# Patient Record
Sex: Male | Born: 1935 | Race: White | Hispanic: No | Marital: Married | Smoking: Former smoker
Health system: Southern US, Community
[De-identification: ages and names within clinical notes are randomized; demographics above are authoritative.]

## PROBLEM LIST (undated history)

## (undated) DIAGNOSIS — E119 Type 2 diabetes mellitus without complications: Secondary | ICD-10-CM

## (undated) DIAGNOSIS — I10 Essential (primary) hypertension: Secondary | ICD-10-CM

## (undated) DIAGNOSIS — N189 Chronic kidney disease, unspecified: Secondary | ICD-10-CM

## (undated) DIAGNOSIS — E785 Hyperlipidemia, unspecified: Secondary | ICD-10-CM

## (undated) HISTORY — PX: APPENDECTOMY: SHX54

## (undated) HISTORY — PX: CATARACT EXTRACTION: SUR2

---

## 2007-02-05 ENCOUNTER — Ambulatory Visit: Payer: Self-pay

## 2007-02-22 ENCOUNTER — Ambulatory Visit: Payer: Self-pay | Admitting: Ophthalmology

## 2007-04-19 ENCOUNTER — Ambulatory Visit: Payer: Self-pay | Admitting: Ophthalmology

## 2009-11-20 ENCOUNTER — Emergency Department: Payer: Self-pay | Admitting: Unknown Physician Specialty

## 2009-12-03 ENCOUNTER — Emergency Department: Payer: Self-pay | Admitting: Emergency Medicine

## 2010-05-24 ENCOUNTER — Inpatient Hospital Stay: Payer: Self-pay | Admitting: Internal Medicine

## 2010-06-19 ENCOUNTER — Ambulatory Visit: Payer: Self-pay | Admitting: Vascular Surgery

## 2010-06-26 ENCOUNTER — Inpatient Hospital Stay: Payer: Self-pay | Admitting: Vascular Surgery

## 2010-06-28 LAB — PATHOLOGY REPORT

## 2010-08-22 ENCOUNTER — Ambulatory Visit: Payer: Self-pay | Admitting: Vascular Surgery

## 2010-12-11 ENCOUNTER — Ambulatory Visit: Payer: Self-pay | Admitting: Vascular Surgery

## 2010-12-18 ENCOUNTER — Inpatient Hospital Stay: Payer: Self-pay | Admitting: Vascular Surgery

## 2012-04-17 ENCOUNTER — Emergency Department: Payer: Self-pay | Admitting: Emergency Medicine

## 2012-04-17 LAB — URINALYSIS, COMPLETE
Blood: NEGATIVE
Glucose,UR: NEGATIVE mg/dL (ref 0–75)
Ketone: NEGATIVE
Nitrite: NEGATIVE
Ph: 5 (ref 4.5–8.0)
Protein: NEGATIVE
RBC,UR: <1 /HPF (ref 0–5)
Specific Gravity: 1.005 (ref 1.003–1.030)
WBC UR: 5 /HPF (ref 0–5)

## 2012-04-17 LAB — COMPREHENSIVE METABOLIC PANEL
Albumin: 3.9 g/dL (ref 3.4–5.0)
Alkaline Phosphatase: 73 U/L (ref 50–136)
Anion Gap: 8 (ref 7–16)
BUN: 21 mg/dL — ABNORMAL HIGH (ref 7–18)
Bilirubin,Total: 0.4 mg/dL (ref 0.2–1.0)
Chloride: 106 mmol/L (ref 98–107)
Co2: 26 mmol/L (ref 21–32)
Creatinine: 1.35 mg/dL — ABNORMAL HIGH (ref 0.60–1.30)
EGFR (Non-African Amer.): 47 — ABNORMAL LOW
Osmolality: 286 (ref 275–301)
SGPT (ALT): 15 U/L (ref 12–78)
Total Protein: 7.2 g/dL (ref 6.4–8.2)

## 2012-04-17 LAB — CBC
HGB: 14.3 g/dL (ref 13.0–18.0)
MCH: 32.8 pg (ref 26.0–34.0)
MCV: 95 fL (ref 80–100)
Platelet: 159 10*3/uL (ref 150–440)
RBC: 4.35 10*6/uL — ABNORMAL LOW (ref 4.40–5.90)
RDW: 13.5 % (ref 11.5–14.5)

## 2012-04-17 LAB — CK TOTAL AND CKMB (NOT AT ARMC): CK, Total: 44 U/L (ref 35–232)

## 2012-04-17 LAB — LIPASE, BLOOD: Lipase: 166 U/L (ref 73–393)

## 2018-10-19 ENCOUNTER — Emergency Department: Payer: Medicare Other

## 2018-10-19 ENCOUNTER — Other Ambulatory Visit: Payer: Self-pay

## 2018-10-19 ENCOUNTER — Inpatient Hospital Stay
Admission: EM | Admit: 2018-10-19 | Discharge: 2018-10-21 | DRG: 312 | Disposition: A | Discharge status: Home / Self Care | Payer: Medicare Other | Attending: Internal Medicine | Admitting: Internal Medicine

## 2018-10-19 DIAGNOSIS — Z79899 Other long term (current) drug therapy: Secondary | ICD-10-CM | POA: Diagnosis not present

## 2018-10-19 DIAGNOSIS — E785 Hyperlipidemia, unspecified: Secondary | ICD-10-CM | POA: Diagnosis present

## 2018-10-19 DIAGNOSIS — R7989 Other specified abnormal findings of blood chemistry: Secondary | ICD-10-CM | POA: Diagnosis present

## 2018-10-19 DIAGNOSIS — I10 Essential (primary) hypertension: Secondary | ICD-10-CM | POA: Insufficient documentation

## 2018-10-19 DIAGNOSIS — R946 Abnormal results of thyroid function studies: Secondary | ICD-10-CM | POA: Diagnosis present

## 2018-10-19 DIAGNOSIS — R000 Tachycardia, unspecified: Secondary | ICD-10-CM | POA: Diagnosis present

## 2018-10-19 DIAGNOSIS — E1122 Type 2 diabetes mellitus with diabetic chronic kidney disease: Secondary | ICD-10-CM | POA: Diagnosis present

## 2018-10-19 DIAGNOSIS — N183 Chronic kidney disease, stage 3 unspecified: Secondary | ICD-10-CM | POA: Diagnosis present

## 2018-10-19 DIAGNOSIS — Z823 Family history of stroke: Secondary | ICD-10-CM

## 2018-10-19 DIAGNOSIS — Z87891 Personal history of nicotine dependence: Secondary | ICD-10-CM | POA: Diagnosis not present

## 2018-10-19 DIAGNOSIS — Z1159 Encounter for screening for other viral diseases: Secondary | ICD-10-CM

## 2018-10-19 DIAGNOSIS — Z8673 Personal history of transient ischemic attack (TIA), and cerebral infarction without residual deficits: Secondary | ICD-10-CM | POA: Diagnosis not present

## 2018-10-19 DIAGNOSIS — R55 Syncope and collapse: Secondary | ICD-10-CM | POA: Diagnosis present

## 2018-10-19 DIAGNOSIS — I129 Hypertensive chronic kidney disease with stage 1 through stage 4 chronic kidney disease, or unspecified chronic kidney disease: Secondary | ICD-10-CM | POA: Diagnosis present

## 2018-10-19 DIAGNOSIS — E119 Type 2 diabetes mellitus without complications: Secondary | ICD-10-CM

## 2018-10-19 DIAGNOSIS — Z801 Family history of malignant neoplasm of trachea, bronchus and lung: Secondary | ICD-10-CM

## 2018-10-19 HISTORY — DX: Type 2 diabetes mellitus without complications: E11.9

## 2018-10-19 HISTORY — DX: Chronic kidney disease, unspecified: N18.9

## 2018-10-19 HISTORY — DX: Hyperlipidemia, unspecified: E78.5

## 2018-10-19 HISTORY — DX: Essential (primary) hypertension: I10

## 2018-10-19 LAB — COMPREHENSIVE METABOLIC PANEL
ALT: 15 U/L (ref 0–44)
AST: 20 U/L (ref 15–41)
Albumin: 4.1 g/dL (ref 3.5–5.0)
Alkaline Phosphatase: 64 U/L (ref 38–126)
Anion gap: 8 (ref 5–15)
BUN: 18 mg/dL (ref 8–23)
CO2: 25 mmol/L (ref 22–32)
Calcium: 9.1 mg/dL (ref 8.9–10.3)
Chloride: 107 mmol/L (ref 98–111)
Creatinine, Ser: 1.44 mg/dL — ABNORMAL HIGH (ref 0.61–1.24)
GFR calc Af Amer: 48 mL/min — ABNORMAL LOW (ref >60)
GFR calc non Af Amer: 41 mL/min — ABNORMAL LOW (ref >60)
Glucose, Bld: 172 mg/dL — ABNORMAL HIGH (ref 70–99)
Potassium: 4.1 mmol/L (ref 3.5–5.1)
Sodium: 140 mmol/L (ref 135–145)
Total Bilirubin: 0.6 mg/dL (ref 0.3–1.2)
Total Protein: 7.3 g/dL (ref 6.5–8.1)

## 2018-10-19 LAB — TROPONIN I: Troponin I: <0.03 ng/mL (ref <0.03)

## 2018-10-19 LAB — CBC WITH DIFFERENTIAL/PLATELET
Abs Immature Granulocytes: 0.06 10*3/uL (ref 0.00–0.07)
Basophils Absolute: 0 10*3/uL (ref 0.0–0.1)
Basophils Relative: 1 %
Eosinophils Absolute: 0.2 10*3/uL (ref 0.0–0.5)
Eosinophils Relative: 3 %
HCT: 41.1 % (ref 39.0–52.0)
Hemoglobin: 13.7 g/dL (ref 13.0–17.0)
Immature Granulocytes: 1 %
Lymphocytes Relative: 19 %
Lymphs Abs: 1.1 10*3/uL (ref 0.7–4.0)
MCH: 30.6 pg (ref 26.0–34.0)
MCHC: 33.3 g/dL (ref 30.0–36.0)
MCV: 91.7 fL (ref 80.0–100.0)
Monocytes Absolute: 0.4 10*3/uL (ref 0.1–1.0)
Monocytes Relative: 8 %
Neutro Abs: 3.9 10*3/uL (ref 1.7–7.7)
Neutrophils Relative %: 68 %
Platelets: 165 10*3/uL (ref 150–400)
RBC: 4.48 MIL/uL (ref 4.22–5.81)
RDW: 14.3 % (ref 11.5–15.5)
WBC: 5.7 10*3/uL (ref 4.0–10.5)
nRBC: 0 % (ref 0.0–0.2)

## 2018-10-19 LAB — APTT: aPTT: 31 seconds (ref 24–36)

## 2018-10-19 LAB — TSH: TSH: 8.625 u[IU]/mL — ABNORMAL HIGH (ref 0.350–4.500)

## 2018-10-19 LAB — SARS CORONAVIRUS 2 BY RT PCR (HOSPITAL ORDER, PERFORMED IN ~~LOC~~ HOSPITAL LAB): SARS Coronavirus 2: NEGATIVE

## 2018-10-19 LAB — PROTIME-INR
INR: 0.9 (ref 0.8–1.2)
Prothrombin Time: 12.2 seconds (ref 11.4–15.2)

## 2018-10-19 LAB — BRAIN NATRIURETIC PEPTIDE: B Natriuretic Peptide: 345 pg/mL — ABNORMAL HIGH (ref 0.0–100.0)

## 2018-10-19 NOTE — ED Notes (Signed)
ED TO INPATIENT HANDOFF REPORT  ED Nurse Name and Phone #: Berline Lopesgracie [phone]  S Name/Age/Gender Joshua ErikssonAndrew C Rogers 85 y.o. male Room/Bed: ED09A/ED09A  Code Status   Code Status: Not on file  Home/SNF/Other Home Patient oriented to: self place time situation  Is this baseline? Yes   Triage Complete: Triage complete  Chief Complaint New onset Afib  Triage Note Patient has syncope episode after mowing lawn. Reports was feeling palpitations then dizzy. Afib on monitor upon ems arrival. Patrient converted  prior to arrival.    Allergies Not on File  Level of Care/Admitting Diagnosis ED Disposition    ED Disposition Condition Comment   Admit  Hospital Area: [facility] [[id number]]  Level of Care: Med-Surg [16]  Covid Evaluation: Confirmed COVID Negative  Diagnosis: Syncope [206001]  Admitting Physician: Oralia ManisWILLIS, DAVID [[id number]]  Attending Physician: Oralia ManisWILLIS, DAVID [phone][[id number]]  Estimated length of stay: past midnight tomorrow  Certification:: I certify this patient will need inpatient services for at least 2 midnights  PT Class (Do Not Modify): Inpatient [101]  PT Acc Code (Do Not Modify): Private [1]       B Medical/Surgery History Past Medical History:  Diagnosis Date  . CKD (chronic kidney disease)   . Diabetes (HCC)   . HLD (hyperlipidemia)   . Hypertension    Past Surgical History:  Procedure Laterality Date  . APPENDECTOMY    . CATARACT EXTRACTION       A IV Location/Drains/Wounds Patient Lines/Drains/Airways Status   Active Line/Drains/Airways    Name:   Placement date:   Placement time:   Site:   Days:   Peripheral IV 10/19/18 Left Antecubital   10/19/18    1902    Antecubital   less than 1          Intake/Output Last 24 hours No intake or output data in the 24 hours ending 10/19/18 2343  Labs/Imaging Results for orders placed or performed during the hospital encounter of 10/19/18 (from the past 48 hour(s))  CBC with Differential      Status: None   Collection Time: 10/19/18  7:12 PM  Result Value Ref Range   WBC 5.7 4.0 - 10.5 K/uL   RBC 4.48 4.22 - 5.81 MIL/uL   Hemoglobin 13.7 13.0 - 17.0 g/dL   HCT 36.641.1 44.039.0 - 34.752.0 %   MCV 91.7 80.0 - 100.0 fL   MCH 30.6 26.0 - 34.0 pg   MCHC 33.3 30.0 - 36.0 g/dL   RDW 42.514.3 95.611.5 - 38.715.5 %   Platelets 165 150 - 400 K/uL   nRBC 0.0 0.0 - 0.2 %   Neutrophils Relative % 68 %   Neutro Abs 3.9 1.7 - 7.7 K/uL   Lymphocytes Relative 19 %   Lymphs Abs 1.1 0.7 - 4.0 K/uL   Monocytes Relative 8 %   Monocytes Absolute 0.4 0.1 - 1.0 K/uL   Eosinophils Relative 3 %   Eosinophils Absolute 0.2 0.0 - 0.5 K/uL   Basophils Relative 1 %   Basophils Absolute 0.0 0.0 - 0.1 K/uL   Immature Granulocytes 1 %   Abs Immature Granulocytes 0.06 0.00 - 0.07 K/uL    Comment: Performed at [facility], [address] [place], [place] [id number]  Comprehensive metabolic panel     Status: Abnormal   Collection Time: 10/19/18  7:12 PM  Result Value Ref Range   Sodium 140 135 - 145 mmol/L   Potassium 4.1 3.5 - 5.1 mmol/L   Chloride 107  98 - 111 mmol/L   CO2 25 22 - 32 mmol/L   Glucose, Bld 172 (H) 70 - 99 mg/dL   BUN 18 8 - 23 mg/dL   Creatinine, Ser 3.61 (H) 0.61 - 1.24 mg/dL   Calcium 9.1 8.9 - 44.3 mg/dL   Total Protein 7.3 6.5 - 8.1 g/dL   Albumin 4.1 3.5 - 5.0 g/dL   AST 20 15 - 41 U/L   ALT 15 0 - 44 U/L   Alkaline Phosphatase 64 38 - 126 U/L   Total Bilirubin 0.6 0.3 - 1.2 mg/dL   GFR calc non Af Amer 41 (L) >60 mL/min   GFR calc Af Amer 48 (L) >60 mL/min   Anion gap 8 5 - 15    Comment: Performed at [facility], [address] [place], [place] [address]  Troponin I - Once     Status: None   Collection Time: 10/19/18  7:12 PM  Result Value Ref Range   Troponin I <0.03 <0.03 ng/mL    Comment: Performed at [facility], [address] [place], [place] [address]  Brain natriuretic peptide     Status: Abnormal   Collection Time: 10/19/18  7:12 PM   Result Value Ref Range   B Natriuretic Peptide 345.0 (H) 0.0 - 100.0 pg/mL    Comment: Performed at [facility], [address] [place], [place] [address]  TSH     Status: Abnormal   Collection Time: 10/19/18  7:12 PM  Result Value Ref Range   TSH 8.625 (H) 0.350 - 4.500 uIU/mL    Comment: Performed by a 3rd Generation assay with a functional sensitivity of <=0.01 uIU/mL. Performed at [facility], [address] [place], [place] [address]   Protime-INR     Status: None   Collection Time: 10/19/18  7:12 PM  Result Value Ref Range   Prothrombin Time 12.2 11.4 - 15.2 seconds   INR 0.9 0.8 - 1.2    Comment: (NOTE) INR goal varies based on device and disease states. Performed at [facility], [address] [place], [place] [address]   APTT     Status: None   Collection Time: 10/19/18  7:12 PM  Result Value Ref Range   aPTT 31 24 - 36 seconds    Comment: Performed at [facility], [address] [place], [place] [address]  SARS Coronavirus 2 (CEPHEID- Performed in [facility] hospital lab), Hosp Order     Status: None   Collection Time: 10/19/18  7:12 PM  Result Value Ref Range   SARS Coronavirus 2 NEGATIVE NEGATIVE    Comment: (NOTE) If result is NEGATIVE SARS-CoV-2 target nucleic acids are NOT DETECTED. The SARS-CoV-2 RNA is generally detectable in upper and lower  respiratory specimens during the acute phase of infection. The lowest  concentration of SARS-CoV-2 viral copies this assay can detect is 250  copies / mL. A negative result does not preclude SARS-CoV-2 infection  and should not be used as the sole basis for treatment or other  patient management decisions.  A negative result may occur with  improper specimen collection / handling, submission of specimen other  than nasopharyngeal swab, presence of viral mutation(s) within the  areas targeted by this assay, and inadequate number of viral copies  (<250 copies / mL). A  negative result must be combined with clinical  observations, patient history, and epidemiological information. If result is POSITIVE SARS-CoV-2 target nucleic acids are DETECTED. The  SARS-CoV-2 RNA is generally detectable in upper and lower  respiratory specimens dur ing the acute phase of infection.  Positive  results are indicative of active infection with SARS-CoV-2.  Clinical  correlation with patient history and other diagnostic information is  necessary to determine patient infection status.  Positive results do  not rule out bacterial infection or co-infection with other viruses. If result is PRESUMPTIVE POSTIVE SARS-CoV-2 nucleic acids MAY BE PRESENT.   A presumptive positive result was obtained on the submitted specimen  and confirmed on repeat testing.  While 2019 novel coronavirus  (SARS-CoV-2) nucleic acids may be present in the submitted sample  additional confirmatory testing may be necessary for epidemiological  and / or clinical management purposes  to differentiate between  SARS-CoV-2 and other Sarbecovirus currently known to infect humans.  If clinically indicated additional testing with an alternate test  methodology [phone]) is advised. The SARS-CoV-2 RNA is generally  detectable in upper and lower respiratory sp ecimens during the acute  phase of infection. The expected result is Negative. Fact Sheet for Patients:  BoilerBrush.com.cy Fact Sheet for Healthcare Providers: https://pope.com/ This test is not yet approved or cleared by the [place] FDA and has been authorized for detection and/or diagnosis of SARS-CoV-2 by FDA under an Emergency Use Authorization (EUA).  This EUA will remain in effect (meaning this test can be used) for the duration of the COVID-19 declaration under Section 564(b)(1) of the Act, 21 U.S.C. section 360bbb-3(b)(1), unless the authorization is terminated or revoked sooner. Performed  at [facility], [address] [place], [place] [address]    Dg Chest Port 1 View  Result Date: 10/19/2018 CLINICAL DATA:  Syncope. EXAM: PORTABLE CHEST 1 VIEW COMPARISON:  Radiographs of December 11, 2010. FINDINGS: The heart size and mediastinal contours are within normal limits. Both lungs are clear. No pneumothorax or pleural effusion is noted. Atherosclerosis of thoracic aorta is noted. The visualized skeletal structures are unremarkable. IMPRESSION: No acute cardiopulmonary abnormality seen. Aortic Atherosclerosis (ICD10-I70.0). Electronically Signed   By: Lupita Raider M.D.   On: 10/19/2018 19:42    Pending Labs [employer] (From admission, onward)    Start     Ordered   Signed and Held  CBC  (enoxaparin (LOVENOX)    CrCl >/= 30 ml/min)  Once,   R    Comments:  Baseline for enoxaparin therapy IF NOT ALREADY DRAWN.  Notify MD if PLT < 100 K.    Signed and Held   Signed and Held  Creatinine, serum  (enoxaparin (LOVENOX)    CrCl >/= 30 ml/min)  Once,   R    Comments:  Baseline for enoxaparin therapy IF NOT ALREADY DRAWN.    Signed and Held   Signed and Held  Creatinine, serum  (enoxaparin (LOVENOX)    CrCl >/= 30 ml/min)  Weekly,   R    Comments:  while on enoxaparin therapy    Signed and Held   Signed and Held  Basic metabolic panel  Tomorrow morning,   R     Signed and Held   Signed and Held  CBC  Tomorrow morning,   R     Signed and Held          Vitals/Pain Today's Vitals   10/19/18 2100 10/19/18 2130 10/19/18 2200 10/19/18 2230  BP: (!) 196/81 (!) 196/85 (!) 194/81 (!) 183/80  Pulse: 79 83 81 80  Resp: Temp:  TempSrc:      SpO2: 98% 97% 94% 96%  Weight:      Height:      PainSc:        Isolation Precautions Droplet and Contact precautions  Medications Medications - No data to display  Mobility walks Moderate fall risk   Focused Assessments Cardiac Assessment Handoff:  Cardiac Rhythm: Atrial fibrillation Lab Results   Component Value Date   CKTOTAL 44 04/17/2012   CKMB 1.1 04/17/2012   TROPONINI <0.03 10/19/2018   No results found for: DDIMER Does the Patient currently have chest pain? No     R Recommendations: See Admitting Provider Note  Report given to:   Additional Notes: new onset afib

## 2018-10-19 NOTE — ED Provider Notes (Signed)
[facility] Emergency Department Provider Note  ____________________________________________   I have reviewed the triage vital signs and the nursing notes. Where available I have reviewed prior notes and, if possible and indicated, outside hospital notes.    HISTORY  Chief Complaint Near Syncope and Palpitations    HPI Joshua Rogers is a 85 y.o. male patient seen and evaluated during the coronavirus epidemic during a time with low staffing presents today after a near syncopal event.  He states he may be 80s and passed out for "just a second".  He states he was out mowing the lawn on a riding mower, felt pretty good came and felt flushed, did not have chest pain but became very lightheaded, sat down rapidly, did not hit his head, he states he lost consciousness for "maybe a second", it was on carpet.  EMS, found him with a tachycardia which was in the 180s, they did have him Valsalva it was a narrow complex tachycardia and afterwards he was in sinus rhythm.  Patient has had no chest pain and he states he feels "pretty good now.  He has not passed out like this before.  He denies any fever chills nausea or vomiting.     Past Medical History:  Diagnosis Date  . Hypertension     There are no active problems to display for this patient.   History reviewed. No pertinent surgical history.  Prior to Admission medications   Medication Sig Start Date End Date Taking? Authorizing Provider  amLODipine (NORVASC) 2.5 MG tablet TAKE 1 TABLET BY MOUTH ONCE A DAY 09/07/18  Yes [provider]  finasteride (PROSCAR) 5 MG tablet Take 5 mg by mouth daily. 03/04/18  Yes [provider]  losartan (COZAAR) 100 MG tablet TAKE 1 TABLET BY MOUTH ONCE DAILY 06/15/18  Yes [provider]  tamsulosin (FLOMAX) 0.4 MG CAPS capsule Take 0.4 mg by mouth daily. 03/04/18  Yes [provider]  zolpidem (AMBIEN) 10 MG tablet TAKE 1 TABLET BY MOUTH NIGHTLY  AS NEEDED FOR SLEEP 08/17/18   [provider]    Allergies Patient has no allergy information on record.  History reviewed. No pertinent family history.  Social History Social History   Tobacco Use  . Smoking status: Never Smoker  . Smokeless tobacco: Never Used  Substance Use Topics  . Alcohol use: Not on file  . Drug use: Not on file    Review of Systems Constitutional: No fever/chills Eyes: No visual changes. ENT: No sore throat. No stiff neck no neck pain Cardiovascular: Denies chest pain. Respiratory: Denies shortness of breath. Gastrointestinal:   no vomiting.  No diarrhea.  No constipation. Genitourinary: Negative for dysuria. Musculoskeletal: Negative lower extremity swelling Skin: Negative for rash. Neurological: Negative for severe headaches, focal weakness or numbness.   ____________________________________________   PHYSICAL EXAM:  VITAL SIGNS: ED Triage Vitals  Enc Vitals Group     BP 10/19/18 1904 (!) 212/93     Pulse Rate 10/19/18 1904 (!) 101     Resp 10/19/18 1904 18     Temp 10/19/18 1904 97.9 F (36.6 C)     Temp Source 10/19/18 1904 Oral     SpO2 10/19/18 1904 98 %     Weight 10/19/18 1906 150 lb (68 kg)     Height 10/19/18 1906 5\' 9"  (1.753 m)     Head Circumference --      Peak Flow --      Pain Score 10/19/18 1905  0     Pain Loc --      Pain Edu? --      Excl. in GC? --     Constitutional: Alert and oriented. Well appearing and in no acute distress. Eyes: Conjunctivae are normal Head: Atraumatic HEENT: No congestion/rhinnorhea. Mucous membranes are moist.  Oropharynx non-erythematous Neck:   Nontender with no meningismus, no masses, no stridor Cardiovascular: Normal rate, regular rhythm. Grossly normal heart sounds.  Good peripheral circulation. Respiratory: Normal respiratory effort.  No retractions. Lungs CTAB. Abdominal: Soft and nontender. No distention. No guarding no rebound Back:  There is no focal tenderness or  step off.  there is no midline tenderness there are no lesions noted. there is no CVA tenderness  Musculoskeletal: No lower extremity tenderness, no upper extremity tenderness. No joint effusions, no DVT signs strong distal pulses no edema Neurologic:  Normal speech and language. No gross focal neurologic deficits are appreciated.  Skin:  Skin is warm, dry and intact. No rash noted. Psychiatric: Mood and affect are normal. Speech and behavior are normal.  ____________________________________________   LABS (all labs ordered are listed, but only abnormal results are displayed)  Labs Reviewed  COMPREHENSIVE METABOLIC PANEL - Abnormal; Notable for the following components:      Result Value   Glucose, Bld 172 (*)    Creatinine, Ser 1.44 (*)    GFR calc non Af Amer 41 (*)    GFR calc Af Amer 48 (*)    All other components within normal limits  BRAIN NATRIURETIC PEPTIDE - Abnormal; Notable for the following components:   B Natriuretic Peptide 345.0 (*)    All other components within normal limits  TSH - Abnormal; Notable for the following components:   TSH 8.625 (*)    All other components within normal limits  SARS CORONAVIRUS 2 (HOSPITAL ORDER, PERFORMED IN [facility])  CBC WITH DIFFERENTIAL/PLATELET  TROPONIN I  PROTIME-INR  APTT    Pertinent labs  results that were available during my care of the patient were reviewed by me and considered in my medical decision making (see chart for details). ____________________________________________  EKG  I personally interpreted any EKGs ordered by me or triage Sinus rhythm rate 98 bpm, no acute ST elevation or depression, normal axis unremarkable EKG ____________________________________________  RADIOLOGY  Pertinent labs & imaging results that were available during my care of the patient were reviewed by me and considered in my medical decision making (see chart for details). If possible, patient and/or family made aware  of any abnormal findings.  Dg Chest Port 1 View  Result Date: 10/19/2018 CLINICAL DATA:  Syncope. EXAM: PORTABLE CHEST 1 VIEW COMPARISON:  Radiographs of December 11, 2010. FINDINGS: The heart size and mediastinal contours are within normal limits. Both lungs are clear. No pneumothorax or pleural effusion is noted. Atherosclerosis of thoracic aorta is noted. The visualized skeletal structures are unremarkable. IMPRESSION: No acute cardiopulmonary abnormality seen. Aortic Atherosclerosis (ICD10-I70.0). Electronically Signed   By: Lupita Raider M.D.   On: 10/19/2018 19:42   ____________________________________________    PROCEDURES  Procedure(s) performed: None  Procedures  Critical Care performed: None  ____________________________________________   INITIAL IMPRESSION / ASSESSMENT AND PLAN / ED COURSE  Pertinent labs & imaging results that were available during my care of the patient were reviewed by me and considered in my medical decision making (see chart for details).  Had a syncopal event in the context of a tacky dysrhythmia, EMS is given this  to me.  It could have been atrial fibrillation with rapid ventricular spots but I suspect it was more likely SVT.  In any event he does no history of this, he is at this time asymptomatic but given his significant age the fact that this happened after exertion, and the fact that it was accompanied by syncope and he lives at his own house, makes me think that he would benefit from cardio monitoring overnight, I discussed with the hospitalist and they agree.    ____________________________________________   FINAL CLINICAL IMPRESSION(S) / ED DIAGNOSES  Final diagnoses:  Syncope  Tachycardia      This chart was dictated using voice recognition software.  Despite best efforts to proofread,  errors can occur which can change meaning.      Jeanmarie Plant, MD 10/19/18 2006

## 2018-10-19 NOTE — ED Notes (Signed)
Pt ambulated to comode in room with assistance.

## 2018-10-19 NOTE — ED Triage Notes (Signed)
Patient has syncope episode after mowing lawn. Reports was feeling palpitations then dizzy. Afib on monitor upon ems arrival. Patrient converted  prior to arrival.

## 2018-10-19 NOTE — ED Notes (Signed)
Pt given meal tray and OJ at this time

## 2018-10-19 NOTE — H&P (Signed)
[facility] [place] [facility]   PATIENT NAME: Joshua Rogers    MR#:  [id number]  DATE OF BIRTH:  November 28, 1922  DATE OF ADMISSION:  10/19/2018  PRIMARY CARE PHYSICIAN: [name], MD   REQUESTING/REFERRING PHYSICIAN: Alphonzo Lemmings, MD  CHIEF COMPLAINT:   Chief Complaint  Patient presents with  . Near Syncope  . Palpitations    HISTORY OF PRESENT ILLNESS:  Tyler Blech  is a 85 y.o. male who presents with chief complaint as above.  Patient was mowing his lawn today on a riding lawnmower when he felt a little flushed.  He went inside and sat down to rest.  He felt his heart palpating quickly in his chest, but denies any chest pain.  He got up to go talk with a friend and had a syncopal episode.  He was brought to the ED for evaluation.  He was found by EMS in route to the ED to have some short bursts of rapid narrow QRS complex rhythm.  Here in the ED he was noted to be hypertensive, though his heart rate has been normal.  Hospitalist called for admission and evaluation  PAST MEDICAL HISTORY:   Past Medical History:  Diagnosis Date  . CKD (chronic kidney disease)   . Diabetes (HCC)   . HLD (hyperlipidemia)   . Hypertension      PAST SURGICAL HISTORY:   Past Surgical History:  Procedure Laterality Date  . APPENDECTOMY    . CATARACT EXTRACTION       SOCIAL HISTORY:   Social History   Tobacco Use  . Smoking status: Former Games developer  . Smokeless tobacco: Never Used  Substance Use Topics  . Alcohol use: Yes     FAMILY HISTORY:   Family History  Problem Relation Age of Onset  . Stroke Father   . Lung cancer Brother   . Stroke Brother      DRUG ALLERGIES:  Not on File  MEDICATIONS AT HOME:   Prior to Admission medications   Medication Sig Start Date End Date Taking? Authorizing Provider  amLODipine (NORVASC) 2.5 MG tablet TAKE 1 TABLET BY MOUTH ONCE A DAY 09/07/18  Yes [provider]  finasteride (PROSCAR) 5 MG  tablet Take 5 mg by mouth daily. 03/04/18  Yes [provider]  losartan (COZAAR) 100 MG tablet TAKE 1 TABLET BY MOUTH ONCE DAILY 06/15/18  Yes [provider]  tamsulosin (FLOMAX) 0.4 MG CAPS capsule Take 0.4 mg by mouth daily. 03/04/18  Yes [provider]  zolpidem (AMBIEN) 10 MG tablet TAKE 1 TABLET BY MOUTH NIGHTLY AS NEEDED FOR SLEEP 08/17/18  Yes [provider]    REVIEW OF SYSTEMS:  Review of Systems  Constitutional: Negative for chills, fever, malaise/fatigue and weight loss.  HENT: Negative for ear pain, hearing loss and tinnitus.   Eyes: Negative for blurred vision, double vision, pain and redness.  Respiratory: Negative for cough, hemoptysis and shortness of breath.   Cardiovascular: Positive for palpitations. Negative for chest pain, orthopnea and leg swelling.  Gastrointestinal: Negative for abdominal pain, constipation, diarrhea, nausea and vomiting.  Genitourinary: Negative for dysuria, frequency and hematuria.  Musculoskeletal: Negative for back pain, joint pain and neck pain.  Skin:       No acne, rash, or lesions  Neurological: Positive for loss of consciousness. Negative for dizziness, tremors, focal weakness and weakness.  Endo/Heme/Allergies: Negative for polydipsia. Does not bruise/bleed easily.  Psychiatric/Behavioral: Negative for depression. The patient is not nervous/anxious  and does not have insomnia.      VITAL SIGNS:   Vitals:   10/19/18 1904 10/19/18 1906  BP: (!) 212/93   Pulse: (!) 101   Resp: 18   Temp: 97.9 F (36.6 C)   TempSrc: Oral   SpO2: 98%   Weight:  68 kg  Height:  5\' 9"  (1.753 m)   Wt Readings from Last 3 Encounters:  10/19/18 68 kg    PHYSICAL EXAMINATION:  Physical Exam  Vitals reviewed. Constitutional: He is oriented to person, place, and time. He appears well-developed and well-nourished. No distress.  HENT:  Head: Normocephalic and atraumatic.  Mouth/Throat: Oropharynx is clear and  moist.  Eyes: Pupils are equal, round, and reactive to light. Conjunctivae and EOM are normal. No scleral icterus.  Neck: Normal range of motion. Neck supple. No JVD present. No thyromegaly present.  Cardiovascular: Normal rate, regular rhythm and intact distal pulses. Exam reveals no gallop and no friction rub.  No murmur heard. Respiratory: Effort normal and breath sounds normal. No respiratory distress. He has no wheezes. He has no rales.  GI: Soft. Bowel sounds are normal. He exhibits no distension. There is no abdominal tenderness.  Musculoskeletal: Normal range of motion.        General: No edema.     Comments: No arthritis, no gout  Lymphadenopathy:    He has no cervical adenopathy.  Neurological: He is alert and oriented to person, place, and time. No cranial nerve deficit.  No dysarthria, no aphasia  Skin: Skin is warm and dry. No rash noted. No erythema.  Psychiatric: He has a normal mood and affect. His behavior is normal. Judgment and thought content normal.    LABORATORY PANEL:   CBC Recent Labs  Lab 10/19/18 1912  WBC 5.7  HGB 13.7  HCT 41.1  PLT 165   ------------------------------------------------------------------------------------------------------------------  Chemistries  Recent Labs  Lab 10/19/18 1912  NA 140  K 4.1  CL 107  CO2 25  GLUCOSE 172*  BUN 18  CREATININE 1.44*  CALCIUM 9.1  AST 20  ALT 15  ALKPHOS 64  BILITOT 0.6   ------------------------------------------------------------------------------------------------------------------  Cardiac Enzymes Recent Labs  Lab 10/19/18 1912  TROPONINI <0.03   ------------------------------------------------------------------------------------------------------------------  RADIOLOGY:  Dg Chest Port 1 View  Result Date: 10/19/2018 CLINICAL DATA:  Syncope. EXAM: PORTABLE CHEST 1 VIEW COMPARISON:  Radiographs of December 11, 2010. FINDINGS: The heart size and mediastinal contours are within normal  limits. Both lungs are clear. No pneumothorax or pleural effusion is noted. Atherosclerosis of thoracic aorta is noted. The visualized skeletal structures are unremarkable. IMPRESSION: No acute cardiopulmonary abnormality seen. Aortic Atherosclerosis (ICD10-I70.0). Electronically Signed   By: Lupita RaiderJames  Green Jr M.D.   On: 10/19/2018 19:42    EKG:   Orders placed or performed during the hospital encounter of 10/19/18  . ED EKG  . ED EKG  . EKG 12-Lead  . EKG 12-Lead    IMPRESSION AND PLAN:  Principal Problem:   Syncope -unclear etiology, though it seems like he may have had an arrhythmia.  We will keep him on cardiac monitor tonight.  Get an echocardiogram and a cardiology consult Active Problems:   Accelerated hypertension -continue home meds, additional PRN antihypertensives for blood pressure goal less than 160/100   Diabetes (HCC) -sliding scale insulin   HLD (hyperlipidemia) -home dose antilipid   CKD (chronic kidney disease), stage III (HCC) -at baseline, avoid nephrotoxins and monitor  Chart review performed and case discussed with ED provider.  Labs, imaging and/or ECG reviewed by provider and discussed with patient/family. Management plans discussed with the patient and/or family.  COVID-19 status: Tested negative     DVT PROPHYLAXIS: SubQ lovenox   GI PROPHYLAXIS:  None  ADMISSION STATUS: Inpatient  CODE STATUS: Full  TOTAL TIME TAKING CARE OF THIS PATIENT: 45 minutes.   This patient was evaluated in the context of the global COVID-19 pandemic, which necessitated consideration that the patient might be at risk for infection with the SARS-CoV-2 virus that causes COVID-19. Institutional protocols and algorithms that pertain to the evaluation of patients at risk for COVID-19 are in a state of rapid change based on information released by regulatory bodies including the CDC and federal and state organizations. These policies and algorithms were followed to the best of this  provider's knowledge to date during the patient's care at this facility.  Barney Drain 10/19/2018, 9:06 PM  [facility]  Office  [phone]  CC: Primary care physician; [name], MD  Note:  This document was prepared using Dragon voice recognition software and may include unintentional dictation errors.

## 2018-10-20 ENCOUNTER — Inpatient Hospital Stay: Payer: Medicare Other

## 2018-10-20 ENCOUNTER — Other Ambulatory Visit: Payer: Self-pay

## 2018-10-20 ENCOUNTER — Inpatient Hospital Stay
Admit: 2018-10-20 | Discharge: 2018-10-20 | Disposition: A | Discharge status: Home / Self Care | Payer: Medicare Other | Attending: Internal Medicine | Admitting: Internal Medicine

## 2018-10-20 LAB — BASIC METABOLIC PANEL
Anion gap: 5 (ref 5–15)
BUN: 17 mg/dL (ref 8–23)
CO2: 25 mmol/L (ref 22–32)
Calcium: 8.6 mg/dL — ABNORMAL LOW (ref 8.9–10.3)
Chloride: 110 mmol/L (ref 98–111)
Creatinine, Ser: 1.34 mg/dL — ABNORMAL HIGH (ref 0.61–1.24)
GFR calc Af Amer: 52 mL/min — ABNORMAL LOW (ref >60)
GFR calc non Af Amer: 45 mL/min — ABNORMAL LOW (ref >60)
Glucose, Bld: 146 mg/dL — ABNORMAL HIGH (ref 70–99)
Potassium: 4.5 mmol/L (ref 3.5–5.1)
Sodium: 140 mmol/L (ref 135–145)

## 2018-10-20 LAB — GLUCOSE, CAPILLARY
Glucose-Capillary: 102 mg/dL — ABNORMAL HIGH (ref 70–99)
Glucose-Capillary: 102 mg/dL — ABNORMAL HIGH (ref 70–99)
Glucose-Capillary: 116 mg/dL — ABNORMAL HIGH (ref 70–99)
Glucose-Capillary: 157 mg/dL — ABNORMAL HIGH (ref 70–99)
Glucose-Capillary: 175 mg/dL — ABNORMAL HIGH (ref 70–99)

## 2018-10-20 LAB — CBC
HCT: 37.2 % — ABNORMAL LOW (ref 39.0–52.0)
Hemoglobin: 12.2 g/dL — ABNORMAL LOW (ref 13.0–17.0)
MCH: 29.9 pg (ref 26.0–34.0)
MCHC: 32.8 g/dL (ref 30.0–36.0)
MCV: 91.2 fL (ref 80.0–100.0)
Platelets: 153 10*3/uL (ref 150–400)
RBC: 4.08 MIL/uL — ABNORMAL LOW (ref 4.22–5.81)
RDW: 14.2 % (ref 11.5–15.5)
WBC: 7 10*3/uL (ref 4.0–10.5)
nRBC: 0 % (ref 0.0–0.2)

## 2018-10-20 MED ORDER — FINASTERIDE 5 MG PO TABS
5.0000 mg | ORAL_TABLET | Freq: Every day | ORAL | Status: DC
  Administered 2018-10-20 – 2018-10-21 (×2): 5 mg via ORAL
  Filled 2018-10-20 (×2): qty 1

## 2018-10-20 MED ORDER — ENOXAPARIN SODIUM 40 MG/0.4ML ~~LOC~~ SOLN: 40.0000 mg | SUBCUTANEOUS | Status: DC

## 2018-10-20 MED ORDER — PANTOPRAZOLE SODIUM 40 MG PO TBEC
40.0000 mg | DELAYED_RELEASE_TABLET | Freq: Every day | ORAL | Status: DC
  Administered 2018-10-20 – 2018-10-21 (×2): 40 mg via ORAL
  Filled 2018-10-20 (×2): qty 1

## 2018-10-20 MED ORDER — ACETAMINOPHEN 650 MG RE SUPP: 650.0000 mg | Freq: Four times a day (QID) | RECTAL | Status: DC | PRN

## 2018-10-20 MED ORDER — LABETALOL HCL 5 MG/ML IV SOLN: 10.0000 mg | INTRAVENOUS | Status: DC | PRN

## 2018-10-20 MED ORDER — INSULIN ASPART 100 UNIT/ML ~~LOC~~ SOLN
0.0000 [IU] | Freq: Three times a day (TID) | SUBCUTANEOUS | Status: DC
  Administered 2018-10-20: 2 [IU] via SUBCUTANEOUS
  Filled 2018-10-20: qty 1

## 2018-10-20 MED ORDER — SODIUM CHLORIDE 0.9% FLUSH
3.0000 mL | Freq: Two times a day (BID) | INTRAVENOUS | Status: DC
  Administered 2018-10-20 – 2018-10-21 (×2): 3 mL via INTRAVENOUS

## 2018-10-20 MED ORDER — ONDANSETRON HCL 4 MG/2ML IJ SOLN: 4.0000 mg | Freq: Four times a day (QID) | INTRAMUSCULAR | Status: DC | PRN

## 2018-10-20 MED ORDER — ENOXAPARIN SODIUM 30 MG/0.3ML ~~LOC~~ SOLN
30.0000 mg | SUBCUTANEOUS | Status: DC
  Administered 2018-10-20: 30 mg via SUBCUTANEOUS
  Filled 2018-10-20: qty 0.3

## 2018-10-20 MED ORDER — ONDANSETRON HCL 4 MG PO TABS: 4.0000 mg | ORAL_TABLET | Freq: Four times a day (QID) | ORAL | Status: DC | PRN

## 2018-10-20 MED ORDER — LOSARTAN POTASSIUM 50 MG PO TABS
100.0000 mg | ORAL_TABLET | Freq: Every day | ORAL | Status: DC
  Administered 2018-10-20 – 2018-10-21 (×2): 100 mg via ORAL
  Filled 2018-10-20 (×2): qty 2

## 2018-10-20 MED ORDER — TAMSULOSIN HCL 0.4 MG PO CAPS
0.4000 mg | ORAL_CAPSULE | Freq: Every day | ORAL | Status: DC
  Administered 2018-10-20 – 2018-10-21 (×2): 0.4 mg via ORAL
  Filled 2018-10-20 (×2): qty 1

## 2018-10-20 MED ORDER — METOPROLOL TARTRATE 25 MG PO TABS
25.0000 mg | ORAL_TABLET | Freq: Two times a day (BID) | ORAL | Status: DC
  Administered 2018-10-20 – 2018-10-21 (×3): 25 mg via ORAL
  Filled 2018-10-20 (×3): qty 1

## 2018-10-20 MED ORDER — ACETAMINOPHEN 325 MG PO TABS: 650.0000 mg | ORAL_TABLET | Freq: Four times a day (QID) | ORAL | Status: DC | PRN

## 2018-10-20 MED ORDER — AMLODIPINE BESYLATE 5 MG PO TABS
2.5000 mg | ORAL_TABLET | Freq: Every day | ORAL | Status: DC
  Administered 2018-10-20: 2.5 mg via ORAL
  Filled 2018-10-20: qty 1

## 2018-10-20 MED ORDER — INSULIN ASPART 100 UNIT/ML ~~LOC~~ SOLN: 0.0000 [IU] | Freq: Every day | SUBCUTANEOUS | Status: DC

## 2018-10-20 NOTE — Progress Notes (Signed)
PHARMACIST - PHYSICIAN COMMUNICATION  CONCERNING:  Enoxaparin (Lovenox) for DVT Prophylaxis    RECOMMENDATION: Patient was prescribed enoxaprin 30mg  q24 hours for VTE prophylaxis.   Filed Weights   10/19/18 1906 10/20/18 0019  Weight: 150 lb (68 kg) 151 lb (68.5 kg)    Body mass index is 22.3 kg/m.  Estimated Creatinine Clearance: 32.7 mL/min (A) (by C-G formula based on SCr of 1.34 mg/dL (H)).   Based on [facility] policy patient is candidate for enoxaparin 40mg  every 24 hour dosing based on CrCl >74ml/min   DESCRIPTION: Pharmacy has adjusted enoxaparin dose per [facility] policy.  Patient is now receiving enoxaparin 40mg  every 24 hours.   Ronnald Ramp, PharmD, BCPS Clinical Pharmacist  10/20/2018 8:34 AM

## 2018-10-20 NOTE — Progress Notes (Signed)
Advanced care plan.  Purpose of the Encounter: CODE STATUS  Parties in Attendance: Patient himself  Patient's Decision Capacity: Intact  Subjective/Patient's story: Patient is 85 year old with history of hypertension, hyperlipidemia, diabetes type 2 and chronic kidney disease who is presenting to the emergency room with syncope.  Patient was noted to have elevated heart rate and noted to have elevated blood pressure.  Patient is admitted for further evaluation and therapy.   Objective/Medical story  Based on his advanced age him being 27 I want him to know his wishes regarding his desires for cardiac and pulmonary resuscitation.    Goals of care determination:  Patient states that he was doing well prior to this and wants everything to be done with a reasonable means.  Due to his age  CODE STATUS: Full code   Time spent discussing advanced care planning: 16 minutes

## 2018-10-20 NOTE — Progress Notes (Signed)
Patient assisted to phone to update wife.

## 2018-10-20 NOTE — Consult Note (Addendum)
[facility]  CARDIOLOGY CONSULT NOTE  Patient ID: Joshua Rogers MRN: [id number] DOB/AGE: 1922-10-15 85 y.o.  Admit date: 10/19/2018 Referring Physician Anne Hahn Primary Physician [facility] Primary Cardiologist None per patient Reason for Consultation syncope  HPI: 85 year old male referred for evaluation of syncope.  The patient has a history of hypertension, type 2 diabetes, hyperlipidemia on diet therapy, history of stroke, status post right carotid endarterectomy, and CKD stage III. He lives at home with his wife and is active for his age. The patient was in his usual state of health yesterday evening while cutting grass using a riding lawnmower. He came inside, sat down to rest, still asymptomatic, then got out of his chair. He states that when he stood up, he felt weak, as he often does with position changes, and felt as if he would pass out. He states he passed out briefly, hitting the carpeted floor, and immediately regained consciousness. He felt too weak to stand up, so laid on the floor for about an hour until he regained strength and stood up. During this time, he had no chest pain or shortness of breath, but did feel his heart racing. He eventually sat down to eat dinner, told his wife about the episode, then called his daughter who called EMS. Upon their arrival, the patient was reportedly in a rapid narrow QRS tachyarrhythmia in the 180s, which resolved after Valsalva. The patient reports that he can recall feeling better after that. While in ER, the patient remained in sinus rhythm, with labs revealing creatinine 1.44, BUN 18, troponin less than 0.03, BNP 345, TSH elevated at 8.625, negative COVID, hemoglobin 12.2, and hematocrit 37.2.  ECG revealed sinus rhythm at a rate of 98 bpm without acute ST or T wave abnormalities.  Chest x-ray revealed normal heart size, clear lungs, without evidence of pleural effusion, with no acute cardiopulmonary process. Currently, the patient feels well  without chest pain, shortness of breath, palpitations, presyncope or syncope.   Review of systems complete and found to be negative unless listed above     Past Medical History:  Diagnosis Date  . CKD (chronic kidney disease)   . Diabetes (HCC)   . HLD (hyperlipidemia)   . Hypertension     Past Surgical History:  Procedure Laterality Date  . APPENDECTOMY    . CATARACT EXTRACTION      Medications Prior to Admission  Medication Sig Dispense Refill Last Dose  . amLODipine (NORVASC) 2.5 MG tablet TAKE 1 TABLET BY MOUTH ONCE A DAY   10/19/2018 at 0700  . finasteride (PROSCAR) 5 MG tablet Take 5 mg by mouth daily.   10/19/2018 at 0700  . losartan (COZAAR) 100 MG tablet TAKE 1 TABLET BY MOUTH ONCE DAILY   10/19/2018 at 0700  . tamsulosin (FLOMAX) 0.4 MG CAPS capsule Take 0.4 mg by mouth daily.   10/19/2018 at 0700  . zolpidem (AMBIEN) 10 MG tablet TAKE 1 TABLET BY MOUTH NIGHTLY AS NEEDED FOR SLEEP   prn at prn   Social History   Socioeconomic History  . Marital status: Married    Spouse name: Not on file  . Number of children: Not on file  . Years of education: Not on file  . Highest education level: Not on file  Occupational History  . Not on file  Social Needs  . Financial resource strain: Not on file  . Food insecurity:    Worry: Not on file    Inability: Not on file  . Transportation needs:  Medical: Not on file    Non-medical: Not on file  Tobacco Use  . Smoking status: Former Games developermoker  . Smokeless tobacco: Never Used  Substance and Sexual Activity  . Alcohol use: Yes  . Drug use: Not on file  . Sexual activity: Not on file  Lifestyle  . Physical activity:    Days per week: Not on file    Minutes per session: Not on file  . Stress: Not on file  Relationships  . Social connections:    Talks on phone: Not on file    Gets together: Not on file    Attends religious service: Not on file    Active member of club or organization: Not on file    Attends meetings of clubs  or organizations: Not on file    Relationship status: Not on file  . Intimate partner violence:    Fear of current or ex partner: Not on file    Emotionally abused: Not on file    Physically abused: Not on file    Forced sexual activity: Not on file  Other Topics Concern  . Not on file  Social History Narrative  . Not on file    Family History  Problem Relation Age of Onset  . Stroke Father   . Lung cancer Brother   . Stroke Brother       Review of systems complete and found to be negative unless listed above      PHYSICAL EXAM  General: Thin elderly gentleman lying in bed in no acute distress HEENT:  Normocephalic and atramatic Neck:  No JVD.  Lungs: Clear bilaterally to auscultation, normal effort of breathing on room air. Heart: HRRR . 2/6 holosystolic murmur Abdomen: nondistended  Msk:  Back normal, gait not assessed. Normal strength and tone for age. Able to sit up in bed and move to side of bed without assistance Extremities: No clubbing, cyanosis or edema.   Neuro: Alert and oriented X 3. Psych:  Good affect, responds appropriately  Labs:   Lab Results  Component Value Date   WBC 7.0 10/20/2018   HGB 12.2 (L) 10/20/2018   HCT 37.2 (L) 10/20/2018   MCV 91.2 10/20/2018   PLT 153 10/20/2018    Recent Labs  Lab 10/19/18 1912 10/20/18 0325  NA 140 140  K 4.1 4.5  CL 107 110  CO2 25 25  BUN 18 17  CREATININE 1.44* 1.34*  CALCIUM 9.1 8.6*  PROT 7.3  --   BILITOT 0.6  --   ALKPHOS 64  --   ALT 15  --   AST 20  --   GLUCOSE 172* 146*   Lab Results  Component Value Date   CKTOTAL 44 04/17/2012   CKMB 1.1 04/17/2012   TROPONINI <0.03 10/19/2018   No results found for: CHOL No results found for: HDL No results found for: LDLCALC No results found for: TRIG No results found for: CHOLHDL No results found for: LDLDIRECT    Radiology: Dg Chest Port 1 View  Result Date: 10/19/2018 CLINICAL DATA:  Syncope. EXAM: PORTABLE CHEST 1 VIEW COMPARISON:   Radiographs of December 11, 2010. FINDINGS: The heart size and mediastinal contours are within normal limits. Both lungs are clear. No pneumothorax or pleural effusion is noted. Atherosclerosis of thoracic aorta is noted. The visualized skeletal structures are unremarkable. IMPRESSION: No acute cardiopulmonary abnormality seen. Aortic Atherosclerosis (ICD10-I70.0). Electronically Signed   By: Lupita RaiderJames  Green Jr M.D.   On: 10/19/2018 19:42  EKG: sinus rhythm   ASSESSMENT AND PLAN:  1.  Syncope, likely multifactorial, after having been outdoors in the heat with poor fluid intake that day, with some degree of orthostasis, noted to be tachycardic with rate in 180s with narrow QRS per EMS, which resolved with valsalva, with elevated TSH with no prior history. 2.  Elevated TSH of 8.625 with previous TSH in normal limits in 2015. Could be contributing to tachyarrhythmia.  3. CKD stage 3, creatinine currently 1.34  4. Hypertension, elevated this morning 5. Hyperlipidemia, on diet therapy 6. Elevated BNP to 345  Recommendations: 1. 2D echocardiogram 2. Carotid ultrasound 3. Continue to monitor on telemetry for arrhythmia 4. Management of elevated TSH per hospitalist 5. Consider adding metoprolol succinate 25 mg daily for better blood pressure control, though monitoring of orthostasis 6. Ambulate today  Signed: Leanora Ivanoff PA-C 10/20/2018, 8:22 AM

## 2018-10-20 NOTE — Progress Notes (Signed)
[employer] - [place] at [facility]                                                                                                                                                                                  Patient Demographics   Joshua Rogers, is a 85 y.o. male, DOB - December 14, 1922, ZOX:[id number]  Admit date - 10/19/2018   Admitting Physician Oralia Manis, MD  Outpatient Primary MD for the patient is [name], MD   LOS - 1  Subjective: Patient states that he is feeling better blood pressure still elevated    Review of Systems:   CONSTITUTIONAL: No documented fever. No fatigue, weakness. No weight gain, no weight loss.  EYES: No blurry or double vision.  ENT: No tinnitus. No postnasal drip. No redness of the oropharynx.  RESPIRATORY: No cough, no wheeze, no hemoptysis. No dyspnea.  CARDIOVASCULAR: No chest pain. No orthopnea. No palpitations. No syncope.  GASTROINTESTINAL: No nausea, no vomiting or diarrhea. No abdominal pain. No melena or hematochezia.  GENITOURINARY: No dysuria or hematuria.  ENDOCRINE: No polyuria or nocturia. No heat or cold intolerance.  HEMATOLOGY: No anemia. No bruising. No bleeding.  INTEGUMENTARY: No rashes. No lesions.  MUSCULOSKELETAL: No arthritis. No swelling. No gout.  NEUROLOGIC: No numbness, tingling, or ataxia. No seizure-type activity.  PSYCHIATRIC: No anxiety. No insomnia. No ADD.    Vitals:   Vitals:   10/19/18 2230 10/20/18 0019 10/20/18 0411 10/20/18 0724  BP: (!) 183/80 (!) 126/91 (!) 165/63 (!) 175/78  Pulse: 80 96 85 80  Resp: Temp:  98 F (36.7 C) 98.4 F (36.9 C) 97.9 F (36.6 C)  TempSrc:  Oral Oral Oral  SpO2: 96% 99% 98% 97%  Weight:  68.5 kg    Height:   (1.753 m)      Wt Readings from Last 3 Encounters:  10/20/18 68.5 kg     Intake/Output Summary (Last 24 hours) at 10/20/2018 1448 Last data filed at 10/20/2018 1356 Gross per 24 hour  Intake 240 ml  Output 1700 ml  Net  -1460 ml    Physical Exam:   GENERAL: Pleasant-appearing in no apparent distress.  HEAD, EYES, EARS, NOSE AND THROAT: Atraumatic, normocephalic. Extraocular muscles are intact. Pupils equal and reactive to light. Sclerae anicteric. No conjunctival injection. No oro-pharyngeal erythema.  NECK: Supple. There is no jugular venous distention. No bruits, no lymphadenopathy, no thyromegaly.  HEART: Regular rate and rhythm,. No murmurs, no rubs, no clicks.  LUNGS: Clear to auscultation bilaterally. No rales or rhonchi. No wheezes.  ABDOMEN: Soft, flat, nontender, nondistended. Has good bowel sounds. No hepatosplenomegaly appreciated.  EXTREMITIES: No  evidence of any cyanosis, clubbing, or peripheral edema.  +2 pedal and radial pulses bilaterally.  NEUROLOGIC: The patient is alert, awake, and oriented x3 with no focal motor or sensory deficits appreciated bilaterally.  SKIN: Moist and warm with no rashes appreciated.  Psych: Not anxious, depressed LN: No inguinal LN enlargement    Antibiotics   Anti-infectives (From admission, onward)   None      Medications   Scheduled Meds: . amLODipine  2.5 mg Oral Daily  . [START ON 10/21/2018] enoxaparin (LOVENOX) injection  40 mg Subcutaneous Q24H  . finasteride  5 mg Oral Daily  . insulin aspart  0-5 Units Subcutaneous QHS  . insulin aspart  0-9 Units Subcutaneous TID WC  . losartan  100 mg Oral Daily  . metoprolol tartrate  25 mg Oral BID  . pantoprazole  40 mg Oral Daily  . tamsulosin  0.4 mg Oral Daily   Continuous Infusions: PRN Meds:.acetaminophen **OR** acetaminophen, labetalol, ondansetron **OR** ondansetron (ZOFRAN) IV   Data Review:   Micro Results Recent Results (from the past 240 hour(s))  SARS Coronavirus 2 (CEPHEID- Performed in [facility] hospital lab), Hosp Order     Status: None   Collection Time: 10/19/18  7:12 PM  Result Value Ref Range Status   SARS Coronavirus 2 NEGATIVE NEGATIVE Final    Comment: (NOTE) If result  is NEGATIVE SARS-CoV-2 target nucleic acids are NOT DETECTED. The SARS-CoV-2 RNA is generally detectable in upper and lower  respiratory specimens during the acute phase of infection. The lowest  concentration of SARS-CoV-2 viral copies this assay can detect is 250  copies / mL. A negative result does not preclude SARS-CoV-2 infection  and should not be used as the sole basis for treatment or other  patient management decisions.  A negative result may occur with  improper specimen collection / handling, submission of specimen other  than nasopharyngeal swab, presence of viral mutation(s) within the  areas targeted by this assay, and inadequate number of viral copies  (<250 copies / mL). A negative result must be combined with clinical  observations, patient history, and epidemiological information. If result is POSITIVE SARS-CoV-2 target nucleic acids are DETECTED. The SARS-CoV-2 RNA is generally detectable in upper and lower  respiratory specimens dur ing the acute phase of infection.  Positive  results are indicative of active infection with SARS-CoV-2.  Clinical  correlation with patient history and other diagnostic information is  necessary to determine patient infection status.  Positive results do  not rule out bacterial infection or co-infection with other viruses. If result is PRESUMPTIVE POSTIVE SARS-CoV-2 nucleic acids MAY BE PRESENT.   A presumptive positive result was obtained on the submitted specimen  and confirmed on repeat testing.  While 2019 novel coronavirus  (SARS-CoV-2) nucleic acids may be present in the submitted sample  additional confirmatory testing may be necessary for epidemiological  and / or clinical management purposes  to differentiate between  SARS-CoV-2 and other Sarbecovirus currently known to infect humans.  If clinically indicated additional testing with an alternate test  methodology [phone]) is advised. The SARS-CoV-2 RNA is generally   detectable in upper and lower respiratory sp ecimens during the acute  phase of infection. The expected result is Negative. Fact Sheet for Patients:  BoilerBrush.com.cy Fact Sheet for Healthcare Providers: https://pope.com/ This test is not yet approved or cleared by the [place] FDA and has been authorized for detection and/or diagnosis of SARS-CoV-2 by FDA under an Emergency Use Authorization (EUA).  This EUA will remain in effect (meaning this test can be used) for the duration of the COVID-19 declaration under Section 564(b)(1) of the Act, 21 U.S.C. section 360bbb-3(b)(1), unless the authorization is terminated or revoked sooner. Performed at [facility], [address], [place], [place] [address]     Radiology Reports Dg Chest [place] 1 View  Result Date: 10/19/2018 CLINICAL DATA:  Syncope. EXAM: PORTABLE CHEST 1 VIEW COMPARISON:  Radiographs of December 11, 2010. FINDINGS: The heart size and mediastinal contours are within normal limits. Both lungs are clear. No pneumothorax or pleural effusion is noted. Atherosclerosis of thoracic aorta is noted. The visualized skeletal structures are unremarkable. IMPRESSION: No acute cardiopulmonary abnormality seen. Aortic Atherosclerosis (ICD10-I70.0). Electronically Signed   By: Lupita Raider M.D.   On: 10/19/2018 19:42     CBC Recent Labs  Lab 10/19/18 1912 10/20/18 0325  WBC 5.7 7.0  HGB 13.7 12.2*  HCT 41.1 37.2*  PLT 165 153  MCV 91.7 91.2  MCH 30.6 29.9  MCHC 33.3 32.8  RDW 14.3 14.2  LYMPHSABS 1.1  --   MONOABS 0.4  --   EOSABS 0.2  --   BASOSABS 0.0  --     Chemistries  Recent Labs  Lab 10/19/18 1912 10/20/18 0325  NA 140 140  K 4.1 4.5  CL 107 110  CO2 25 25  GLUCOSE 172* 146*  BUN 18 17  CREATININE 1.44* 1.34*  CALCIUM 9.1 8.6*  AST 20  --   ALT 15  --   ALKPHOS 64  --   BILITOT 0.6  --     ------------------------------------------------------------------------------------------------------------------ estimated creatinine clearance is 32.7 mL/min (A) (by C-G formula based on SCr of 1.34 mg/dL (H)). ------------------------------------------------------------------------------------------------------------------ No results for input(s): HGBA1C in the last 72 hours. ------------------------------------------------------------------------------------------------------------------ No results for input(s): CHOL, HDL, LDLCALC, TRIG, CHOLHDL, LDLDIRECT in the last 72 hours. ------------------------------------------------------------------------------------------------------------------ Recent Labs    10/19/18 1912  TSH 8.625*   ------------------------------------------------------------------------------------------------------------------ No results for input(s): VITAMINB12, FOLATE, FERRITIN, TIBC, IRON, RETICCTPCT in the last 72 hours.  Coagulation profile Recent Labs  Lab 10/19/18 1912  INR 0.9    No results for input(s): DDIMER in the last 72 hours.  Cardiac Enzymes Recent Labs  Lab 10/19/18 1912  TROPONINI <0.03   ------------------------------------------------------------------------------------------------------------------ Invalid input(s): POCBNP    Assessment & Plan  Patient is 85 year old presenting with syncope  Syncope -due to elevated blood pressure and elevated heart rate, echocardiogram with heart pending, carotid Dopplers results pending  Accelerated hypertension -patient started on Norvasc continue Cozaar, start patient on metoprolol  Diabetes (HCC) -sliding scale insulin  HLD (hyperlipidemia) -home dose antilipid  CKD (chronic kidney disease), stage III (HCC) -at baseline, avoid nephrotoxins and monitor      Code Status Orders  (From admission, onward)         Start     Ordered   10/20/18 0018  Full code  Continuous      10/20/18 0018        Code Status History    This patient has a current code status but no historical code status.           Consults cardiology  DVT Prophylaxis  Lovenox   Lab Results  Component Value Date   PLT 153 10/20/2018     Time Spent in minutes 35 minutes greater than 50% of time spent in care coordination and counseling patient regarding the condition and plan of care.   Auburn Bilberry M.D on 10/20/2018  at 2:48 PM  Between 7am to 6pm - Pager - [phone]  After 6pm go to [email or web address] - Social research officer, governmentpassword [email or web address]  [employer]   Office  [phone]

## 2018-10-20 NOTE — Plan of Care (Signed)
  Problem: Clinical Measurements: Goal: Will remain free from infection Outcome: Progressing   Problem: Clinical Measurements: Goal: Cardiovascular complication will be avoided Outcome: Progressing   Problem: Education: Goal: Knowledge of General Education information will improve Description Including pain rating scale, medication(s)/side effects and non-pharmacologic comfort measures Outcome: Progressing

## 2018-10-20 NOTE — Plan of Care (Signed)
  Problem: Education: Goal: Knowledge of General Education information will improve Description Including pain rating scale, medication(s)/side effects and non-pharmacologic comfort measures Outcome: Progressing Note:  Patient was admitted at 0010. Patient profile completed. No complaints of pain. Patient denies any dizziness. Patient performs self catherizations at home.

## 2018-10-21 LAB — GLUCOSE, CAPILLARY
Glucose-Capillary: 104 mg/dL — ABNORMAL HIGH (ref 70–99)
Glucose-Capillary: 117 mg/dL — ABNORMAL HIGH (ref 70–99)

## 2018-10-21 MED ORDER — AMLODIPINE BESYLATE 5 MG PO TABS
5.0000 mg | ORAL_TABLET | Freq: Every day | ORAL | Status: DC
  Administered 2018-10-21: 5 mg via ORAL
  Filled 2018-10-21: qty 1

## 2018-10-21 MED ORDER — METOPROLOL TARTRATE 25 MG PO TABS: 25.0000 mg | ORAL_TABLET | Freq: Two times a day (BID) | ORAL | 0 refills | Status: DC

## 2018-10-21 MED ORDER — AMLODIPINE BESYLATE 5 MG PO TABS: 5.0000 mg | ORAL_TABLET | Freq: Every day | ORAL | 1 refills | Status: DC

## 2018-10-21 NOTE — Progress Notes (Signed)
[facility]  SUBJECTIVE: The patient reports feeling well this morning with no recurrent palpitations, presyncope or syncope. He denies chest pain or shortness of breath.   Vitals:   10/20/18 0724 10/20/18 1554 10/20/18 1957 10/21/18 0331  BP: (!) 175/78 (!) 130/58 (!) 147/65 121/66  Pulse: 80 64 72 69  Resp:  18 17 16   Temp: 97.9 F (36.6 C) 97.9 F (36.6 C) 98.5 F (36.9 C) 98.4 F (36.9 C)  TempSrc: Oral Oral Oral Oral  SpO2: 97% 98% 97% 96%  Weight:      Height:         Intake/Output Summary (Last 24 hours) at 10/21/2018 [id number] Last data filed at 10/21/2018 0800 Gross per 24 hour  Intake 240 ml  Output 1250 ml  Net -1010 ml      PHYSICAL EXAM  General: Well developed, well nourished, in no acute distress HEENT:  Normocephalic and atramatic Neck:  No JVD.  Lungs: normal effort of breathing on room air, no wheezing Heart: HRRR . 2/6 systolic murmur Abdomen: nondistended  Msk:  Back normal, gait not assessed. Strength and tone normal for age. Extremities: No clubbing, cyanosis or edema.   Neuro: Alert and oriented X 3. Psych:  Good affect, responds appropriately   LABS: Basic Metabolic Panel: Recent Labs    10/19/18 1912 10/20/18 0325  NA 140 140  K 4.1 4.5  CL 107 110  CO2 25 25  GLUCOSE 172* 146*  BUN 18 17  CREATININE 1.44* 1.34*  CALCIUM 9.1 8.6*   Liver Function Tests: Recent Labs    10/19/18 1912  AST 20  ALT 15  ALKPHOS 64  BILITOT 0.6  PROT 7.3  ALBUMIN 4.1   No results for input(s): LIPASE, AMYLASE in the last 72 hours. CBC: Recent Labs    10/19/18 1912 10/20/18 0325  WBC 5.7 7.0  NEUTROABS 3.9  --   HGB 13.7 12.2*  HCT 41.1 37.2*  MCV 91.7 91.2  PLT 165 153   Cardiac Enzymes: Recent Labs    10/19/18 1912  TROPONINI <0.03   BNP: Invalid input(s): POCBNP D-Dimer: No results for input(s): DDIMER in the last 72 hours. Hemoglobin A1C: No results for input(s): HGBA1C in the last 72 hours. Fasting Lipid Panel: No  results for input(s): CHOL, HDL, LDLCALC, TRIG, CHOLHDL, LDLDIRECT in the last 72 hours. Thyroid Function Tests: Recent Labs    10/19/18 1912  TSH 8.625*   Anemia Panel: No results for input(s): VITAMINB12, FOLATE, FERRITIN, TIBC, IRON, RETICCTPCT in the last 72 hours.  [place] Carotid Bilateral  Result Date: 10/20/2018 CLINICAL DATA:  Syncope, hypertension, visual disturbance and diabetes. EXAM: BILATERAL CAROTID DUPLEX ULTRASOUND TECHNIQUE: Wallace CullensGray scale imaging, color Doppler and duplex ultrasound were performed of bilateral carotid and vertebral arteries in the neck. COMPARISON:  12/03/2017 FINDINGS: Criteria: Quantification of carotid stenosis is based on velocity parameters that correlate the residual internal carotid diameter with NASCET-based stenosis levels, using the diameter of the distal internal carotid lumen as the denominator for stenosis measurement. The following velocity measurements were obtained: RIGHT ICA:  90/20 cm/sec CCA:  81/8 cm/sec SYSTOLIC ICA/CCA RATIO:  1.1 ECA:  105 cm/sec LEFT ICA:  105/15 cm/sec CCA:  84/8 cm/sec SYSTOLIC ICA/CCA RATIO:  1.3 ECA:  82 cm/sec RIGHT CAROTID ARTERY: There is a mild amount of calcified plaque at the level of the carotid bulb. No evidence of right-sided ICA plaque or stenosis. Velocities and waveforms are normal. RIGHT VERTEBRAL ARTERY: Antegrade flow with normal waveform and velocity.  LEFT CAROTID ARTERY: Minimal plaque at the level of the left carotid bulb. The left ICA demonstrates no evidence of plaque or stenosis. Velocities and waveforms are normal. LEFT VERTEBRAL ARTERY: Antegrade flow with normal waveform and velocity. IMPRESSION: Mild plaque at the level of both carotid bulbs. No evidence of ICA plaque or stenosis bilaterally. Electronically Signed   By: Irish Lack M.D.   On: 10/20/2018 15:27   Dg Chest Port 1 View  Result Date: 10/19/2018 CLINICAL DATA:  Syncope. EXAM: PORTABLE CHEST 1 VIEW COMPARISON:  Radiographs of December 11, 2010.  FINDINGS: The heart size and mediastinal contours are within normal limits. Both lungs are clear. No pneumothorax or pleural effusion is noted. Atherosclerosis of thoracic aorta is noted. The visualized skeletal structures are unremarkable. IMPRESSION: No acute cardiopulmonary abnormality seen. Aortic Atherosclerosis (ICD10-I70.0). Electronically Signed   By: Lupita Raider M.D.   On: 10/19/2018 19:42     Echo Pending  TELEMETRY: Sinus rhythm, 60s  ASSESSMENT AND PLAN:  Principal Problem:   Syncope Active Problems:   Diabetes (HCC)   HLD (hyperlipidemia)   Accelerated hypertension   CKD (chronic kidney disease), stage III (HCC)    1. Syncope, likely multifactorial, after having been outdoors in the heat with poor fluid intake that day, with some degree of orthostasis, noted to be tachycardic with rate in 180s with narrow QRS per EMS, which resolved with valsalva, markedly elevated blood pressure, with elevated TSH with no prior history. Carotid ultrasound normal. 2.  Elevated TSH of 8.625 with previous TSH in normal limits in 2015. Could be contributing to tachyarrhythmia.  3. CKD stage 3, creatinine currently 1.34  4. Hypertension, elevated this morning, much improved now 5. Hyperlipidemia, on diet therapy  Recommendations: 1. Review 2D echocardiogram. 2. Continue amlodipine, losartan, and metoprolol tartrate 3. Continue to monitor on telemetry for arrhythmia 4. Recommend monitoring TSH as outpatient 5. Ambulate today. If echocardiogram unremarkable, and patient remains stable, patient may be discharged today from cardiac standpoint; please make follow up appointment with Dr. Darrold Junker as outpatient in 1 week, at which time Holter monitor will likely be placed.  Joshua Ivanoff, PA-C 10/21/2018 8:07 AM

## 2018-10-21 NOTE — Discharge Summary (Signed)
[employer] [facility] - [place] at [facility]  Joshua Rogers, 85 y.o., DOB 1922/10/28, MRN [id number]. Admission date: 10/19/2018 Discharge Date 10/21/2018 Primary MD [name], MD Admitting Physician Oralia Manis, MD  Admission Diagnosis  Syncope [R55] Tachycardia [R00.0]  Discharge Diagnosis   Principal Problem: Syncope likely multifactorial including elevated blood pressure and elevated heart rate Accelerated hypertension Diabetes type 2 Hyperlipidemia Chronic kidney disease stage III      Hospital Course  Patient is 85 year old white male who was admitted with syncope after mowing his lawn.  Patient in the emergency room was noted to have accelerated high blood pressure and elevated heart rate.  She was monitored on telemetry his blood pressure is now much improved and heart rate is improved.  Patient denies any chest pain or palpitations.  Monitored on telemetry without any arrhythmias.  Echocardiogram of the heart was done results currently pending.  Patient will need to have this followed up with primary care provider if not read prior to discharge.             Consults  cardiology  Significant Tests:  See full reports for all details     US Carotid Bilateral  Result Date: 10/20/2018 CLINICAL DATA:  Syncope, hypertension, visual disturbance and diabetes. EXAM: BILATERAL CAROTID DUPLEX ULTRASOUND TECHNIQUE: Wallace Cullens scale imaging, color Doppler and duplex ultrasound were performed of bilateral carotid and vertebral arteries in the neck. COMPARISON:  12/03/2017 FINDINGS: Criteria: Quantification of carotid stenosis is based on velocity parameters that correlate the residual internal carotid diameter with NASCET-based stenosis levels, using the diameter of the distal internal carotid lumen as the denominator for stenosis measurement. The following velocity measurements were obtained: RIGHT ICA:  90/20 cm/sec CCA:  81/8 cm/sec SYSTOLIC ICA/CCA RATIO:  1.1 ECA:  105  cm/sec LEFT ICA:  105/15 cm/sec CCA:  84/8 cm/sec SYSTOLIC ICA/CCA RATIO:  1.3 ECA:  82 cm/sec RIGHT CAROTID ARTERY: There is a mild amount of calcified plaque at the level of the carotid bulb. No evidence of right-sided ICA plaque or stenosis. Velocities and waveforms are normal. RIGHT VERTEBRAL ARTERY: Antegrade flow with normal waveform and velocity. LEFT CAROTID ARTERY: Minimal plaque at the level of the left carotid bulb. The left ICA demonstrates no evidence of plaque or stenosis. Velocities and waveforms are normal. LEFT VERTEBRAL ARTERY: Antegrade flow with normal waveform and velocity. IMPRESSION: Mild plaque at the level of both carotid bulbs. No evidence of ICA plaque or stenosis bilaterally. Electronically Signed   By: Irish Lack M.D.   On: 10/20/2018 15:27   Dg Chest Port 1 View  Result Date: 10/19/2018 CLINICAL DATA:  Syncope. EXAM: PORTABLE CHEST 1 VIEW COMPARISON:  Radiographs of December 11, 2010. FINDINGS: The heart size and mediastinal contours are within normal limits. Both lungs are clear. No pneumothorax or pleural effusion is noted. Atherosclerosis of thoracic aorta is noted. The visualized skeletal structures are unremarkable. IMPRESSION: No acute cardiopulmonary abnormality seen. Aortic Atherosclerosis (ICD10-I70.0). Electronically Signed   By: Lupita Raider M.D.   On: 10/19/2018 19:42       Today   Subjective:   Joshua Rogers patient feels well denies any complaints  Objective:   Blood pressure (!) 171/66, pulse 62, temperature 98.3 F (36.8 C), temperature source Oral, resp. rate 18, height 5\' 9"  (1.753 m), weight 68.5 kg, SpO2 100 %.  .  Intake/Output Summary (Last 24 hours) at 10/21/2018 1255 Last data filed at 10/21/2018 0800 Gross per 24 hour  Intake -  Output 900 ml  Net -900 ml    Exam VITAL SIGNS: Blood pressure (!) 171/66, pulse 62, temperature 98.3 F (36.8 C), temperature source Oral, resp. rate 18, height  (1.753 m), weight 68.5 kg, SpO2 100  %.  GENERAL:  85 y.o.-year-old patient lying in the bed with no acute distress.  EYES: Pupils equal, round, reactive to light and accommodation. No scleral icterus. Extraocular muscles intact.  HEENT: Head atraumatic, normocephalic. Oropharynx and nasopharynx clear.  NECK:  Supple, no jugular venous distention. No thyroid enlargement, no tenderness.  LUNGS: Normal breath sounds bilaterally, no wheezing, rales,rhonchi or crepitation. No use of accessory muscles of respiration.  CARDIOVASCULAR: S1, S2 normal. No murmurs, rubs, or gallops.  ABDOMEN: Soft, nontender, nondistended. Bowel sounds present. No organomegaly or mass.  EXTREMITIES: No pedal edema, cyanosis, or clubbing.  NEUROLOGIC: Cranial nerves II through XII are intact. Muscle strength 5/5 in all extremities. Sensation intact. Gait not checked.  PSYCHIATRIC: The patient is alert and oriented x 3.  SKIN: No obvious rash, lesion, or ulcer.   Data Review     CBC w Diff:  Lab Results  Component Value Date   WBC 7.0 10/20/2018   HGB 12.2 (L) 10/20/2018   HGB 14.3 04/17/2012   HCT 37.2 (L) 10/20/2018   HCT 41.4 04/17/2012   PLT 153 10/20/2018   PLT 159 04/17/2012   LYMPHOPCT 19 10/19/2018   MONOPCT 8 10/19/2018   EOSPCT 3 10/19/2018   BASOPCT 1 10/19/2018   CMP:  Lab Results  Component Value Date   NA 140 10/20/2018   NA 140 04/17/2012   K 4.5 10/20/2018   K 3.9 04/17/2012   CL 110 10/20/2018   CL 106 04/17/2012   CO2 25 10/20/2018   CO2 26 04/17/2012   BUN 17 10/20/2018   BUN 21 (H) 04/17/2012   CREATININE 1.34 (H) 10/20/2018   CREATININE 1.35 (H) 04/17/2012   PROT 7.3 10/19/2018   PROT 7.2 04/17/2012   ALBUMIN 4.1 10/19/2018   ALBUMIN 3.9 04/17/2012   BILITOT 0.6 10/19/2018   BILITOT 0.4 04/17/2012   ALKPHOS 64 10/19/2018   ALKPHOS 73 04/17/2012   AST 20 10/19/2018   AST 18 04/17/2012   ALT 15 10/19/2018   ALT 15 04/17/2012  .  Micro Results Recent Results (from the past 240 hour(s))  SARS  Coronavirus 2 (CEPHEID- Performed in [facility] hospital lab), Hosp Order     Status: None   Collection Time: 10/19/18  7:12 PM  Result Value Ref Range Status   SARS Coronavirus 2 NEGATIVE NEGATIVE Final    Comment: (NOTE) If result is NEGATIVE SARS-CoV-2 target nucleic acids are NOT DETECTED. The SARS-CoV-2 RNA is generally detectable in upper and lower  respiratory specimens during the acute phase of infection. The lowest  concentration of SARS-CoV-2 viral copies this assay can detect is 250  copies / mL. A negative result does not preclude SARS-CoV-2 infection  and should not be used as the sole basis for treatment or other  patient management decisions.  A negative result may occur with  improper specimen collection / handling, submission of specimen other  than nasopharyngeal swab, presence of viral mutation(s) within the  areas targeted by this assay, and inadequate number of viral copies  (<250 copies / mL). A negative result must be combined with clinical  observations, patient history, and epidemiological information. If result is POSITIVE SARS-CoV-2 target nucleic acids are DETECTED. The SARS-CoV-2 RNA is generally detectable in upper and lower  respiratory specimens dur ing the acute phase of infection.  Positive  results are indicative of active infection with SARS-CoV-2.  Clinical  correlation with patient history and other diagnostic information is  necessary to determine patient infection status.  Positive results do  not rule out bacterial infection or co-infection with other viruses. If result is PRESUMPTIVE POSTIVE SARS-CoV-2 nucleic acids MAY BE PRESENT.   A presumptive positive result was obtained on the submitted specimen  and confirmed on repeat testing.  While 2019 novel coronavirus  (SARS-CoV-2) nucleic acids may be present in the submitted sample  additional confirmatory testing may be necessary for epidemiological  and / or clinical management purposes  to  differentiate between  SARS-CoV-2 and other Sarbecovirus currently known to infect humans.  If clinically indicated additional testing with an alternate test  methodology [phone]([id number]) is advised. The SARS-CoV-2 RNA is generally  detectable in upper and lower respiratory sp ecimens during the acute  phase of infection. The expected result is Negative. Fact Sheet for Patients:  BoilerBrush.com.cyhttps://www.fda.gov/media/136312/download Fact Sheet for Healthcare Providers: https://pope.com/https://www.fda.gov/media/136313/download This test is not yet approved or cleared by the [place] FDA and has been authorized for detection and/or diagnosis of SARS-CoV-2 by FDA under an Emergency Use Authorization (EUA).  This EUA will remain in effect (meaning this test can be used) for the duration of the COVID-19 declaration under Section 564(b)(1) of the Act, 21 U.S.C. section 360bbb-3(b)(1), unless the authorization is terminated or revoked sooner. Performed at [facility], [address] [place], [place] [address]         Code Status Orders  (From admission, onward)         Start     Ordered   10/20/18 0018  Full code  Continuous     10/20/18 0018        Code Status History    This patient has a current code status but no historical code status.          Follow-up Information    Marguarite ArbourSparks, Jeffrey D, MD On 11/01/2018.   Specialty:  Internal Medicine Why:  hosp folloup.......Marland Kitchen.appointment at [facility]4pm Contact information: [address] [place] [address] [phone]           Discharge Medications   Allergies as of 10/21/2018   Not on File     Medication List    TAKE these medications   amLODipine 5 MG tablet Commonly known as:  NORVASC Take 1 tablet (5 mg total) by mouth daily. What changed:    medication strength  how much to take   finasteride 5 MG tablet Commonly known as:  PROSCAR Take 5 mg by mouth daily.   losartan 100 MG tablet Commonly known as:  COZAAR TAKE  1 TABLET BY MOUTH ONCE DAILY   metoprolol tartrate 25 MG tablet Commonly known as:  LOPRESSOR Take 1 tablet (25 mg total) by mouth 2 (two) times daily.   tamsulosin 0.4 MG Caps capsule Commonly known as:  FLOMAX Take 0.4 mg by mouth daily.   zolpidem 10 MG tablet Commonly known as:  AMBIEN TAKE 1 TABLET BY MOUTH NIGHTLY AS NEEDED FOR SLEEP          Total Time in preparing paper work, data evaluation and todays exam - 35 minutes  Auburn BilberryShreyang Frances Joynt M.D on 10/21/2018 at 12:55 PM [facility]   Office  [phone]

## 2018-12-31 LAB — ECHOCARDIOGRAM COMPLETE
Height: 69 in
Weight: 2416 oz

## 2019-08-25 ENCOUNTER — Emergency Department
Admission: EM | Admit: 2019-08-25 | Discharge: 2019-08-25 | Disposition: A | Discharge status: Home / Self Care | Payer: No Typology Code available for payment source | Attending: Student | Admitting: Student

## 2019-08-25 ENCOUNTER — Other Ambulatory Visit: Payer: Self-pay

## 2019-08-25 ENCOUNTER — Emergency Department: Payer: No Typology Code available for payment source

## 2019-08-25 ENCOUNTER — Encounter: Payer: Self-pay | Admitting: Emergency Medicine

## 2019-08-25 DIAGNOSIS — Y92410 Unspecified street and highway as the place of occurrence of the external cause: Secondary | ICD-10-CM | POA: Diagnosis not present

## 2019-08-25 DIAGNOSIS — M549 Dorsalgia, unspecified: Secondary | ICD-10-CM | POA: Insufficient documentation

## 2019-08-25 DIAGNOSIS — Y999 Unspecified external cause status: Secondary | ICD-10-CM | POA: Insufficient documentation

## 2019-08-25 DIAGNOSIS — E119 Type 2 diabetes mellitus without complications: Secondary | ICD-10-CM | POA: Insufficient documentation

## 2019-08-25 DIAGNOSIS — Z87891 Personal history of nicotine dependence: Secondary | ICD-10-CM | POA: Diagnosis not present

## 2019-08-25 DIAGNOSIS — N183 Chronic kidney disease, stage 3 unspecified: Secondary | ICD-10-CM | POA: Insufficient documentation

## 2019-08-25 DIAGNOSIS — Z79899 Other long term (current) drug therapy: Secondary | ICD-10-CM | POA: Insufficient documentation

## 2019-08-25 DIAGNOSIS — I129 Hypertensive chronic kidney disease with stage 1 through stage 4 chronic kidney disease, or unspecified chronic kidney disease: Secondary | ICD-10-CM | POA: Insufficient documentation

## 2019-08-25 DIAGNOSIS — Y93I9 Activity, other involving external motion: Secondary | ICD-10-CM | POA: Diagnosis not present

## 2019-08-25 DIAGNOSIS — G44319 Acute post-traumatic headache, not intractable: Secondary | ICD-10-CM | POA: Diagnosis not present

## 2019-08-25 DIAGNOSIS — S41112A Laceration without foreign body of left upper arm, initial encounter: Secondary | ICD-10-CM | POA: Insufficient documentation

## 2019-08-25 DIAGNOSIS — S0990XA Unspecified injury of head, initial encounter: Secondary | ICD-10-CM | POA: Diagnosis present

## 2019-08-25 MED ORDER — CEPHALEXIN 500 MG PO CAPS: 500.0000 mg | ORAL_CAPSULE | Freq: Two times a day (BID) | ORAL | 0 refills | Status: AC
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
## 2020-11-05 ENCOUNTER — Other Ambulatory Visit: Payer: Self-pay

## 2020-11-05 ENCOUNTER — Other Ambulatory Visit: Payer: Self-pay | Admitting: Family Medicine

## 2020-11-05 ENCOUNTER — Ambulatory Visit
Admission: RE | Admit: 2020-11-05 | Discharge: 2020-11-05 | Disposition: A | Discharge status: Home / Self Care | Payer: Medicare Other | Source: Ambulatory Visit | Attending: Family Medicine | Admitting: Family Medicine

## 2020-11-05 DIAGNOSIS — H5462 Unqualified visual loss, left eye, normal vision right eye: Secondary | ICD-10-CM

## 2020-11-05 DIAGNOSIS — Z8673 Personal history of transient ischemic attack (TIA), and cerebral infarction without residual deficits: Secondary | ICD-10-CM | POA: Diagnosis present

## 2020-11-05 DIAGNOSIS — I6782 Cerebral ischemia: Secondary | ICD-10-CM | POA: Diagnosis not present

## 2021-08-08 ENCOUNTER — Observation Stay
Admission: EM | Admit: 2021-08-08 | Discharge: 2021-08-09 | Disposition: A | Discharge status: Home / Self Care | Payer: Medicare Other | Attending: Internal Medicine | Admitting: Internal Medicine

## 2021-08-08 ENCOUNTER — Emergency Department: Payer: Medicare Other

## 2021-08-08 ENCOUNTER — Other Ambulatory Visit: Payer: Self-pay

## 2021-08-08 DIAGNOSIS — E785 Hyperlipidemia, unspecified: Secondary | ICD-10-CM

## 2021-08-08 DIAGNOSIS — Z79899 Other long term (current) drug therapy: Secondary | ICD-10-CM | POA: Insufficient documentation

## 2021-08-08 DIAGNOSIS — E039 Hypothyroidism, unspecified: Secondary | ICD-10-CM

## 2021-08-08 DIAGNOSIS — R079 Chest pain, unspecified: Secondary | ICD-10-CM | POA: Diagnosis present

## 2021-08-08 DIAGNOSIS — I214 Non-ST elevation (NSTEMI) myocardial infarction: Secondary | ICD-10-CM | POA: Diagnosis not present

## 2021-08-08 DIAGNOSIS — E1122 Type 2 diabetes mellitus with diabetic chronic kidney disease: Secondary | ICD-10-CM | POA: Insufficient documentation

## 2021-08-08 DIAGNOSIS — I4891 Unspecified atrial fibrillation: Secondary | ICD-10-CM

## 2021-08-08 DIAGNOSIS — I129 Hypertensive chronic kidney disease with stage 1 through stage 4 chronic kidney disease, or unspecified chronic kidney disease: Secondary | ICD-10-CM | POA: Diagnosis not present

## 2021-08-08 DIAGNOSIS — I10 Essential (primary) hypertension: Secondary | ICD-10-CM

## 2021-08-08 DIAGNOSIS — I482 Chronic atrial fibrillation, unspecified: Secondary | ICD-10-CM | POA: Diagnosis present

## 2021-08-08 DIAGNOSIS — N1831 Chronic kidney disease, stage 3a: Secondary | ICD-10-CM

## 2021-08-08 DIAGNOSIS — Z87891 Personal history of nicotine dependence: Secondary | ICD-10-CM | POA: Insufficient documentation

## 2021-08-08 DIAGNOSIS — N400 Enlarged prostate without lower urinary tract symptoms: Secondary | ICD-10-CM | POA: Diagnosis present

## 2021-08-08 LAB — COMPREHENSIVE METABOLIC PANEL
ALT: 34 U/L (ref 0–44)
AST: 39 U/L (ref 15–41)
Albumin: 3.2 g/dL — ABNORMAL LOW (ref 3.5–5.0)
Alkaline Phosphatase: 82 U/L (ref 38–126)
Anion gap: 8 (ref 5–15)
BUN: 26 mg/dL — ABNORMAL HIGH (ref 8–23)
CO2: 21 mmol/L — ABNORMAL LOW (ref 22–32)
Calcium: 8.2 mg/dL — ABNORMAL LOW (ref 8.9–10.3)
Chloride: 110 mmol/L (ref 98–111)
Creatinine, Ser: 1.38 mg/dL — ABNORMAL HIGH (ref 0.61–1.24)
GFR, Estimated: 47 mL/min — ABNORMAL LOW (ref >60)
Glucose, Bld: 190 mg/dL — ABNORMAL HIGH (ref 70–99)
Potassium: 4.3 mmol/L (ref 3.5–5.1)
Sodium: 139 mmol/L (ref 135–145)
Total Bilirubin: 0.8 mg/dL (ref 0.3–1.2)
Total Protein: 6.2 g/dL — ABNORMAL LOW (ref 6.5–8.1)

## 2021-08-08 LAB — GLUCOSE, CAPILLARY: Glucose-Capillary: 125 mg/dL — ABNORMAL HIGH (ref 70–99)

## 2021-08-08 LAB — TROPONIN I (HIGH SENSITIVITY)
Troponin I (High Sensitivity): 11 ng/L (ref <18)
Troponin I (High Sensitivity): 159 ng/L (ref <18)
Troponin I (High Sensitivity): 392 ng/L (ref <18)
Troponin I (High Sensitivity): 335 ng/L (ref <18)

## 2021-08-08 LAB — CBC
HCT: 40 % (ref 39.0–52.0)
Hemoglobin: 12.8 g/dL — ABNORMAL LOW (ref 13.0–17.0)
MCH: 29.9 pg (ref 26.0–34.0)
MCHC: 32 g/dL (ref 30.0–36.0)
MCV: 93.5 fL (ref 80.0–100.0)
Platelets: 162 10*3/uL (ref 150–400)
RBC: 4.28 MIL/uL (ref 4.22–5.81)
RDW: 14.2 % (ref 11.5–15.5)
WBC: 4.4 10*3/uL (ref 4.0–10.5)
nRBC: 0 % (ref 0.0–0.2)

## 2021-08-08 LAB — PROTIME-INR
INR: 1 (ref 0.8–1.2)
Prothrombin Time: 13.4 seconds (ref 11.4–15.2)

## 2021-08-08 LAB — TSH: TSH: 1.874 u[IU]/mL (ref 0.350–4.500)

## 2021-08-08 MED ORDER — SODIUM CHLORIDE 0.9 % IV SOLN: INTRAVENOUS | Status: DC

## 2021-08-08 MED ORDER — MORPHINE SULFATE (PF) 2 MG/ML IV SOLN: 0.5000 mg | INTRAVENOUS | Status: DC | PRN

## 2021-08-08 MED ORDER — METOPROLOL TARTRATE 5 MG/5ML IV SOLN: 2.5000 mg | Freq: Once | INTRAVENOUS | Status: DC

## 2021-08-08 MED ORDER — NITROGLYCERIN 0.4 MG SL SUBL
0.4000 mg | SUBLINGUAL_TABLET | SUBLINGUAL | Status: DC | PRN
Start: 2021-08-08 — End: 2021-08-09

## 2021-08-08 MED ORDER — ATORVASTATIN CALCIUM 20 MG PO TABS
40.0000 mg | ORAL_TABLET | Freq: Every evening | ORAL | Status: DC
Start: 2021-08-08 — End: 2021-08-09
  Administered 2021-08-08: 40 mg via ORAL
  Filled 2021-08-08: qty 2

## 2021-08-08 MED ORDER — FINASTERIDE 5 MG PO TABS
5.0000 mg | ORAL_TABLET | Freq: Every day | ORAL | Status: DC
  Administered 2021-08-08 – 2021-08-09 (×2): 5 mg via ORAL
  Filled 2021-08-08 (×2): qty 1

## 2021-08-08 MED ORDER — ASPIRIN 81 MG PO CHEW
324.0000 mg | CHEWABLE_TABLET | Freq: Once | ORAL | Status: DC
  Filled 2021-08-08: qty 4

## 2021-08-08 MED ORDER — AMLODIPINE BESYLATE 5 MG PO TABS
5.0000 mg | ORAL_TABLET | Freq: Every day | ORAL | Status: DC
Start: 2021-08-08 — End: 2021-08-09
  Administered 2021-08-08 – 2021-08-09 (×2): 5 mg via ORAL
  Filled 2021-08-08 (×2): qty 1

## 2021-08-08 MED ORDER — HYDRALAZINE HCL 20 MG/ML IJ SOLN: 5.0000 mg | INTRAMUSCULAR | Status: DC | PRN

## 2021-08-08 MED ORDER — TAMSULOSIN HCL 0.4 MG PO CAPS
0.4000 mg | ORAL_CAPSULE | Freq: Every day | ORAL | Status: DC
  Administered 2021-08-08 – 2021-08-09 (×2): 0.4 mg via ORAL
  Filled 2021-08-08 (×2): qty 1

## 2021-08-08 MED ORDER — LOSARTAN POTASSIUM 50 MG PO TABS
100.0000 mg | ORAL_TABLET | Freq: Every day | ORAL | Status: DC
  Administered 2021-08-08 – 2021-08-09 (×2): 100 mg via ORAL
  Filled 2021-08-08 (×2): qty 2

## 2021-08-08 MED ORDER — ONDANSETRON HCL 4 MG/2ML IJ SOLN
4.0000 mg | Freq: Three times a day (TID) | INTRAMUSCULAR | Status: DC | PRN
Start: 2021-08-08 — End: 2021-08-09

## 2021-08-08 MED ORDER — ASPIRIN EC 81 MG PO TBEC
81.0000 mg | DELAYED_RELEASE_TABLET | Freq: Every day | ORAL | Status: DC
  Administered 2021-08-09: 81 mg via ORAL
  Filled 2021-08-08: qty 1

## 2021-08-08 MED ORDER — ACETAMINOPHEN 325 MG PO TABS: 650.0000 mg | ORAL_TABLET | Freq: Four times a day (QID) | ORAL | Status: DC | PRN

## 2021-08-08 MED ORDER — SODIUM CHLORIDE 0.9 % IV BOLUS
500.0000 mL | Freq: Once | INTRAVENOUS | Status: AC
  Administered 2021-08-08: 500 mL via INTRAVENOUS

## 2021-08-08 MED ORDER — METOPROLOL TARTRATE 25 MG PO TABS
25.0000 mg | ORAL_TABLET | Freq: Two times a day (BID) | ORAL | Status: DC
  Administered 2021-08-08 – 2021-08-09 (×2): 25 mg via ORAL
  Filled 2021-08-08 (×2): qty 1

## 2021-08-08 MED ORDER — DILTIAZEM HCL 25 MG/5ML IV SOLN
10.0000 mg | Freq: Once | INTRAVENOUS | Status: AC
  Administered 2021-08-08: 10 mg via INTRAVENOUS
  Filled 2021-08-08: qty 5

## 2021-08-08 MED ORDER — HEPARIN (PORCINE) 25000 UT/250ML-% IV SOLN
900.0000 [IU]/h | INTRAVENOUS | Status: DC
  Administered 2021-08-08: 1050 [IU]/h via INTRAVENOUS
  Filled 2021-08-08: qty 250

## 2021-08-08 MED ORDER — LEVOTHYROXINE SODIUM 50 MCG PO TABS
50.0000 ug | ORAL_TABLET | Freq: Every day | ORAL | Status: DC
  Administered 2021-08-09: 50 ug via ORAL
  Filled 2021-08-08: qty 1

## 2021-08-08 MED ORDER — HEPARIN BOLUS VIA INFUSION
3900.0000 [IU] | Freq: Once | INTRAVENOUS | Status: AC
  Administered 2021-08-08: 3900 [IU] via INTRAVENOUS
  Filled 2021-08-08: qty 3900

## 2021-08-08 MED ORDER — ZOLPIDEM TARTRATE 5 MG PO TABS
5.0000 mg | ORAL_TABLET | Freq: Every evening | ORAL | Status: DC | PRN
  Administered 2021-08-08: 5 mg via ORAL
  Filled 2021-08-08: qty 1

## 2021-08-08 MED ORDER — DILTIAZEM HCL-DEXTROSE 125-5 MG/125ML-% IV SOLN (PREMIX)
5.0000 mg/h | INTRAVENOUS | Status: DC
  Administered 2021-08-08 – 2021-08-09 (×2): 5 mg/h via INTRAVENOUS
  Filled 2021-08-08 (×2): qty 125

## 2021-08-08 NOTE — ED Notes (Addendum)
This RN received a call from lab for troponin of 159.  MD notified ?

## 2021-08-08 NOTE — ED Notes (Signed)
Pt resting comfortably in bed, NAD. Daughter at bedside. Pt is asking for a coude in & out catheter, as he needs to void & intermittently catheterizes at home. This RN will ask MD. No additional needs verbalized at this time. Bed low & locked; call light & personal items within reach. ?

## 2021-08-08 NOTE — ED Notes (Signed)
Secure msg sent to [facility], RN for ED to [employer]. ?

## 2021-08-08 NOTE — Progress Notes (Signed)
Patient states he has to in/out cath himself at home.   Per MD order- insert foley.  Patient requires the Coude cath due to enlarged prostate.  We do not have a RN on the unit trained to insert Coude.  ?Per MD- ok for patient to insert since he insert the coude cath at home.   ? ?During the procedure, provided the patient with sterile gloves.  One RN also wore sterile gloves to assist patient with the prep and to hand the lubricated catheter.  A second RN held the bag and assisted the patient with standing.   ?After 3 attempts patient, patient reported he sometimes has issues with insertion if he is constipated. Assisted patient to [facility], will try again later on this evening.   ?

## 2021-08-08 NOTE — ED Notes (Signed)
Secure msg sent to Dr. Clyde Lundborg re: pts & family's concern/request about catheterizing while in the hospital. ?

## 2021-08-08 NOTE — ED Notes (Signed)
This RN notified Dr. Lenard Lance in person that pt took ASA 324 mg PO prior to arrival; per MD, okay to not give ASA at this time. ?

## 2021-08-08 NOTE — H&P (Signed)
?History and Physical  ? ? ?Joshua Rogers [id number] DOB: 10-Aug-1922 DOA: 08/08/2021 ? ?Referring MD/NP/PA:  ? ?PCP: Idelle Crouch, MD  ? ?Patient coming from:  The patient is coming from home.   ?      ? ?Chief Complaint: Chest pain, palpitation ? ?HPI: Joshua Rogers is a 88 y.o. male with medical history significant of hypertension, hyperlipidemia, diet-controlled diabetes mellitus, GERD, CKD stage IIIa, BPH, who presents with chest pain and palpitation. ? ?Patient states that his symptoms started this morning.  He has frontal chest pain, which is heavy, moderate, nonradiating.  Associated with palpitation and heart racing.  Patient has generalized weakness.  Denies shortness of breath, cough, fever or chills.  No nausea, vomiting, diarrhea or abdominal pain.  No symptoms of UTI ? ?Patient was found to have new onset atrial fibrillation with heart rate up to 130s, Cardizem drip started in ED. ? ? ?Data Reviewed and ED Course: pt was found to have WBC 4.4, troponin level 11 --> 159, renal function close to baseline, temperature normal, blood pressure 146/92, RR 17, oxygen saturation 97% on room air.  Chest x-ray negative.  Patient is placed in PCU for observation.  Dr. Clayborn Bigness of cardiology is consulted ? ?EKG: I have personally reviewed.  atrial fibrillation, heart rate 125, QTc 489, LAD, poor R wave progression ? ? ?Review of Systems:  ? ?General: no fevers, chills, no body weight gain, has fatigue ?HEENT: no blurry vision, hearing changes or sore throat ?Respiratory: no dyspnea, coughing, wheezing ?CV: has chest pain, palpitations ?GI: no nausea, vomiting, abdominal pain, diarrhea, constipation ?GU: no dysuria, burning on urination, increased urinary frequency, hematuria  ?Ext: no leg edema ?Neuro: no unilateral weakness, numbness, or tingling, no vision change or hearing loss ?Skin: no rash, no skin tear. ?MSK: No muscle spasm, no deformity, no limitation of range of movement in spin ?Heme: No  easy bruising.  ?Travel history: No recent long distant travel. ? ? ?Allergy: No Known Allergies ? ?Past Medical History:  ?Diagnosis Date  ? CKD (chronic kidney disease)   ? Diabetes ([place])   ? HLD (hyperlipidemia)   ? Hypertension   ? ? ?Past Surgical History:  ?Procedure Laterality Date  ? APPENDECTOMY    ? CATARACT EXTRACTION    ? ? ?Social History:  reports that he has quit smoking. He has never used smokeless tobacco. He reports current alcohol use. No history on file for drug use. ? ?Family History:  ?Family History  ?Problem Relation Age of Onset  ? Stroke Father   ? Lung cancer Brother   ? Stroke Brother   ?  ? ?Prior to Admission medications   ?Medication Sig Start Date End Date Taking? Authorizing Provider  ?amLODipine (NORVASC) 5 MG tablet Take 1 tablet (5 mg total) by mouth daily. 10/21/18  Yes Dustin Flock, MD  ?finasteride (PROSCAR) 5 MG tablet Take 5 mg by mouth daily. 03/04/18  Yes [provider]  ?levothyroxine (SYNTHROID) 50 MCG tablet Take 50 mcg by mouth daily. 06/04/21  Yes [provider]  ?losartan (COZAAR) 100 MG tablet TAKE 1 TABLET BY MOUTH ONCE DAILY 06/15/18  Yes [provider]  ?metoprolol tartrate (LOPRESSOR) 25 MG tablet Take 1 tablet (25 mg total) by mouth 2 (two) times daily. 10/21/18  Yes Dustin Flock, MD  ?tamsulosin (FLOMAX) 0.4 MG CAPS capsule Take 0.4 mg by mouth daily. 03/04/18  Yes [provider]  ?zolpidem (AMBIEN) 10 MG tablet TAKE 1 TABLET BY MOUTH  NIGHTLY AS NEEDED FOR SLEEP 08/17/18  Yes [provider]  ? ? ?Physical Exam: ?Vitals:  ? 08/08/21 1430 08/08/21 1510 08/08/21 1615 08/08/21 1729  ?BP: (!) 184/78  (!) 150/83 (!) 141/88  ?Pulse:    (!) 105  ?Resp: 14 16 16 18   ?Temp:    97.7 ?F (36.5 ?C)  ?TempSrc:      ?SpO2:    98%  ?Weight:      ?Height:      ? ?General: Not in acute distress ?HEENT: ?      Eyes: PERRL, EOMI, no scleral icterus. ?      ENT: No discharge from the ears and nose, no pharynx injection, no tonsillar  enlargement.  ?      Neck: No JVD, no bruit, no mass felt. ?Heme: No neck lymph node enlargement. ?Cardiac: S1/S2, irregularly irregular rhythm, no murmurs, No gallops or rubs. ?Respiratory: No rales, wheezing, rhonchi or rubs. ?GI: Soft, nondistended, nontender, no rebound pain, no organomegaly, BS present. ?GU: No hematuria ?Ext: No pitting leg edema bilaterally. 1+DP/PT pulse bilaterally. ?Musculoskeletal: No joint deformities, No joint redness or warmth, no limitation of ROM in spin. ?Skin: No rashes.  ?Neuro: Alert, oriented X3, cranial nerves II-XII grossly intact, moves all extremities normally.  ?Psych: Patient is not psychotic, no suicidal or hemocidal ideation. ? ?Labs on Admission: I have personally reviewed following labs and imaging studies ? ?CBC: ?Recent Labs  ?Lab 08/08/21 ?1018  ?WBC 4.4  ?HGB 12.8*  ?HCT 40.0  ?MCV 93.5  ?PLT 162  ? ?Basic Metabolic Panel: ?Recent Labs  ?Lab 08/08/21 ?1018  ?NA 139  ?K 4.3  ?CL 110  ?CO2 21*  ?GLUCOSE 190*  ?BUN 26*  ?CREATININE 1.38*  ?CALCIUM 8.2*  ? ?GFR: ?Estimated Creatinine Clearance: 28.6 mL/min (A) (by C-G formula based on SCr of 1.38 mg/dL (H)). ?Liver Function Tests: ?Recent Labs  ?Lab 08/08/21 ?1018  ?AST 39  ?ALT 34  ?ALKPHOS 82  ?BILITOT 0.8  ?PROT 6.2*  ?ALBUMIN 3.2*  ? ?No results for input(s): LIPASE, AMYLASE in the last 168 hours. ?No results for input(s): AMMONIA in the last 168 hours. ?Coagulation Profile: ?No results for input(s): INR, PROTIME in the last 168 hours. ?Cardiac Enzymes: ?No results for input(s): CKTOTAL, CKMB, CKMBINDEX, TROPONINI in the last 168 hours. ?BNP (last 3 results) ?No results for input(s): PROBNP in the last 8760 hours. ?HbA1C: ?No results for input(s): HGBA1C in the last 72 hours. ?CBG: ?Recent Labs  ?Lab 08/08/21 ?1737  ?GLUCAP 125*  ? ?Lipid Profile: ?No results for input(s): CHOL, HDL, LDLCALC, TRIG, CHOLHDL, LDLDIRECT in the last 72 hours. ?Thyroid Function Tests: ?No results for input(s): TSH, T4TOTAL, FREET4,  T3FREE, THYROIDAB in the last 72 hours. ?Anemia Panel: ?No results for input(s): VITAMINB12, FOLATE, FERRITIN, TIBC, IRON, RETICCTPCT in the last 72 hours. ?Urine analysis: ?   ?Component Value Date/Time  ? COLORURINE Straw 04/17/2012 1954  ? APPEARANCEUR Clear 04/17/2012 1954  ? LABSPEC 1.005 04/17/2012 1954  ? PHURINE 5.0 04/17/2012 1954  ? GLUCOSEU Negative 04/17/2012 1954  ? HGBUR Negative 04/17/2012 1954  ? BILIRUBINUR Negative 04/17/2012 1954  ? KETONESUR Negative 04/17/2012 1954  ? PROTEINUR Negative 04/17/2012 1954  ? NITRITE Negative 04/17/2012 1954  ? LEUKOCYTESUR 1+ 04/17/2012 1954  ? ?Sepsis Labs: ?@[email or web address] (procalcitonin:4,lacticidven:4) ?)No results found for this or any previous visit (from the past 240 hour(s)).  ? ?Radiological Exams on Admission: ?DG Chest Portable 1 View ? ?Result Date: 08/08/2021 ?CLINICAL DATA:  This morning Atrial fibrillation  central chest pain beginning EXAM: PORTABLE CHEST - 1 VIEW COMPARISON:  10/19/2018 FINDINGS: Cardiomediastinal silhouette and pulmonary vasculature are within normal limits. Lungs are clear. Mild-to-moderate degenerative changes of the acromioclavicular joint. Degenerative changes cervical spine partially visualized. IMPRESSION: No acute cardiopulmonary process. Electronically Signed   By: Miachel Roux M.D.   On: 08/08/2021 10:37   ? ? ? ?Assessment/Plan ?Principal Problem: ?  NSTEMI (non-ST elevated myocardial infarction) ([place]) ?Active Problems: ?  HTN (hypertension) ?  HLD (hyperlipidemia) ?  Chest pain ?  Chronic kidney disease, stage 3a ([place]) ?  New onset atrial fibrillation with RVR ?  Hypothyroidism ?  BPH (benign prostatic hyperplasia) ? ? ?NSTEMI (non-ST elevated myocardial infarction) [facility]): pt has chest pain. Trop 11 --> 159. Dr. Clayborn Bigness of card is consuted. ? ?- place to progressive unit for observation ?-IV heparin ?- Trend Trop ?- prn Nitroglycerin, Morphine ?- aspirin, lipitor  ?- Risk factor stratification: will check FLP and A1C  ?- 2d  echo ? ?HTN (hypertension) ?-IV hydralazine as needed ?-Amlodipine and Cozaar ? ?HLD (hyperlipidemia) ?-Started Lipitor 40 mg daily ? ?Chronic kidney disease, stage 3a ([place]): Stable ?-Follow-up with BMP ? ?New

## 2021-08-08 NOTE — ED Notes (Signed)
This RN assisted pt with urinal. Pt able to get approx 366mL urine out by voiding in the urinal.  ?

## 2021-08-08 NOTE — ED Triage Notes (Signed)
Pt presents to ED with c/o of midsternum chest pain while eating breakfast this morning. Pt presents in a-fib, pt was given 2.5 mg of lopressor by EMS. Pt denies any recent illness to this RN. Pt reports he is unsure but thinks he has a-fib. Pt is tachycardiac in an a-fib rhythm from 100's to 130's. Pt is from home.  ? ?Pt also endorses some weakness, pt denies SOB, pt denies N/V, pt denies any episodes of diaphoresis. Pt is A&Ox4 at this time.  ?

## 2021-08-08 NOTE — Progress Notes (Signed)
ANTICOAGULATION CONSULT NOTE ? ?Pharmacy Consult for heparin  ?Indication:  stemi and afib ? ?No Known Allergies ? ?Patient Measurements: ?Height: 5\' 10"  (177.8 cm) ?Weight: 66.2 kg (146 lb) ?IBW/kg (Calculated) : 73 ?Heparin Dosing Weight: 66.2 kg  ? ?Vital Signs: ?Temp: 97.7 ?F (36.5 ?C) (03/23 0955) ?Temp Source: Oral (03/23 0955) ?BP: 184/78 (03/23 1430) ?Pulse Rate: 91 (03/23 1215) ? ?Labs: ?Recent Labs  ?  08/08/21 ?1018 08/08/21 ?1342  ?HGB 12.8*  --   ?HCT 40.0  --   ?PLT 162  --   ?CREATININE 1.38*  --   ?TROPONINIHS 11 159*  ? ? ?Estimated Creatinine Clearance: 28.6 mL/min (A) (by C-G formula based on SCr of 1.38 mg/dL (H)). ? ? ?Medical History: ?Past Medical History:  ?Diagnosis Date  ? CKD (chronic kidney disease)   ? Diabetes ([facility])   ? HLD (hyperlipidemia)   ? Hypertension   ? ? ?Medications:  ?Per chart review, pt not on anticoagulation prior to admission ? ?Assessment: ?88 y.o. male with a past medical history of hypertension, CKD, diabetes, presented to the ED for chest pain. Pharmacy consulted for heparin dosing/monitoring.  ? ?Goal of Therapy:  ?Heparin level 0.3-0.7 units/ml ?Monitor platelets by anticoagulation protocol: Yes ?  ?Plan:  ?Give 3900 units bolus x 1 ?Start heparin infusion at 1050 units/hr ?Check anti-Xa level in 8 hours and daily while on heparin ?Continue to monitor H&H and platelets ? ?Joshua Rogers ?08/08/2021,2:36 PM ? ? ?

## 2021-08-08 NOTE — ED Notes (Signed)
Medications not yet verified by pharmacy. ?

## 2021-08-08 NOTE — ED Provider Notes (Signed)
? ?[facility] ?Provider Note ? ? ? Event Date/Time  ? First MD Initiated Contact with Patient 08/08/21 [phone]   ?  (approximate) ? ?History  ? ?Chief Complaint: Chest Pain ? ?HPI ? ?Joshua Rogers is a 88 y.o. male with a past medical history of hypertension, CKD, diabetes, presents to the emergency department for chest pain.  According to the patient he awoke this morning with chest discomfort.  Patient found to be in atrial fibrillation by EMS around 130 bpm he was given 2.5 mg of IV metoprolol by EMS.  Patient states he has been feeling weak today as well but denies any nausea dyspnea or diaphoresis.  Patient is currently awake alert and oriented, no distress lying in bed continues to state very slight discomfort in the center of his chest. ? ?Physical Exam  ? ?Triage Vital Signs: ?ED Triage Vitals  ?Enc Vitals Group  ?   BP 08/08/21 0955 (!) 143/95  ?   Pulse Rate 08/08/21 0955 97  ?   Resp 08/08/21 0955 17  ?   Temp 08/08/21 0955 97.7 ?F (36.5 ?C)  ?   Temp Source 08/08/21 0955 Oral  ?   SpO2 08/08/21 0955 97 %  ?   Weight 08/08/21 1006 146 lb (66.2 kg)  ?   Height 08/08/21 1006 5\' 10"  (1.778 m)  ?   Head Circumference --   ?   Peak Flow --   ?   Pain Score 08/08/21 1005 3  ?   Pain Loc --   ?   Pain Edu? --   ?   Excl. in [place]? --   ? ? ?Most recent vital signs: ?Vitals:  ? 08/08/21 0955  ?BP: (!) 143/95  ?Pulse: 97  ?Resp: 17  ?Temp: 97.7 ?F (36.5 ?C)  ?SpO2: 97%  ? ? ?General: Awake, no distress.  ?CV:  Good peripheral perfusion.  Irregular rhythm rate around 110 bpm. ?Resp:  Normal effort.  Equal breath sounds bilaterally.  ?Abd:  No distention.  Soft, nontender.  No rebound or guarding. ?Other:  No lower extremity edema. ? ? ?ED Results / Procedures / Treatments  ? ?EKG ? ?EKG viewed and interpreted by myself shows atrial fibrillation with rapid ventricular response at 125 bpm with a narrow QRS, normal axis, normal intervals, no concerning ST changes. ? ?RADIOLOGY ? ?I have personally  reviewed the chest x-ray images, no acute abnormality seen on my evaluation. ?Chest x-ray read as negative by radiology ? ? ?MEDICATIONS ORDERED IN ED: ?Medications  ?metoprolol tartrate (LOPRESSOR) injection 2.5 mg (has no administration in time range)  ?sodium chloride 0.9 % bolus 500 mL (has no administration in time range)  ? ? ? ?IMPRESSION / MDM / ASSESSMENT AND PLAN / ED COURSE  ?I reviewed the triage vital signs and the nursing notes. ? ?Patient presents to the emergency department for chest pain and weakness.  Patient found to be in atrial fibrillation with rapid ventricular response.  I reviewed the patient's last EKG this appears to be new onset.  I reviewed the patient's last PCP note 06/24/2021.  No mention of atrial fibrillation previously.  We will check labs including cardiac enzymes.  We will continue to closely monitor in the emergency department on telemetry.  We will dose IV diltiazem to try to obtain rate control.  We will dose small amount of IV fluids and continue to closely monitor.  Patient agreeable to plan. ? ?Patient's lab work shows a fairly normal  initial troponin.  Normal CBC and fairly baseline chemistry.  However patient's repeat troponin has now elevated to 159.  Patient's heart rate although controlled around 90 is now back up to 120 to 130 bpm atrial fibrillation.  We will start the patient on diltiazem infusion.  Given the patient's elevated troponin I discussed with the patient and family admission to the hospital for further work-up and treatment.  They are agreeable to this plan. ? ?CRITICAL CARE ?Performed by: Harvest Dark ? ? ?Total critical care time: 30 minutes ? ?Critical care time was exclusive of separately billable procedures and treating other patients. ? ?Critical care was necessary to treat or prevent imminent or life-threatening deterioration. ? ?Critical care was time spent personally by me on the following activities: development of treatment plan with patient  and/or surrogate as well as nursing, discussions with consultants, evaluation of patient's response to treatment, examination of patient, obtaining history from patient or surrogate, ordering and performing treatments and interventions, ordering and review of laboratory studies, ordering and review of radiographic studies, pulse oximetry and re-evaluation of patient's condition. ? ? ?FINAL CLINICAL IMPRESSION(S) / ED DIAGNOSES  ? ?Chest pain ?New onset atrial fibrillation with rapid ventricular response ?Elevated troponin ? ?Note:  This document was prepared using Dragon voice recognition software and may include unintentional dictation errors. ?  ?Harvest Dark, MD ?08/08/21 1431 ? ?

## 2021-08-09 ENCOUNTER — Observation Stay
Admit: 2021-08-09 | Discharge: 2021-08-09 | Disposition: A | Discharge status: Home / Self Care | Payer: Medicare Other | Attending: Internal Medicine | Admitting: Internal Medicine

## 2021-08-09 ENCOUNTER — Other Ambulatory Visit (HOSPITAL_COMMUNITY): Payer: Self-pay

## 2021-08-09 DIAGNOSIS — I4891 Unspecified atrial fibrillation: Secondary | ICD-10-CM | POA: Diagnosis not present

## 2021-08-09 DIAGNOSIS — N1831 Chronic kidney disease, stage 3a: Secondary | ICD-10-CM | POA: Diagnosis not present

## 2021-08-09 DIAGNOSIS — I214 Non-ST elevation (NSTEMI) myocardial infarction: Secondary | ICD-10-CM | POA: Diagnosis not present

## 2021-08-09 LAB — BASIC METABOLIC PANEL
Anion gap: 7 (ref 5–15)
BUN: 32 mg/dL — ABNORMAL HIGH (ref 8–23)
CO2: 22 mmol/L (ref 22–32)
Calcium: 8.4 mg/dL — ABNORMAL LOW (ref 8.9–10.3)
Chloride: 110 mmol/L (ref 98–111)
Creatinine, Ser: 1.47 mg/dL — ABNORMAL HIGH (ref 0.61–1.24)
GFR, Estimated: 43 mL/min — ABNORMAL LOW (ref >60)
Glucose, Bld: 159 mg/dL — ABNORMAL HIGH (ref 70–99)
Potassium: 4.1 mmol/L (ref 3.5–5.1)
Sodium: 139 mmol/L (ref 135–145)

## 2021-08-09 LAB — CBC
HCT: 37.3 % — ABNORMAL LOW (ref 39.0–52.0)
Hemoglobin: 12.1 g/dL — ABNORMAL LOW (ref 13.0–17.0)
MCH: 29.8 pg (ref 26.0–34.0)
MCHC: 32.4 g/dL (ref 30.0–36.0)
MCV: 91.9 fL (ref 80.0–100.0)
Platelets: 178 10*3/uL (ref 150–400)
RBC: 4.06 MIL/uL — ABNORMAL LOW (ref 4.22–5.81)
RDW: 14.2 % (ref 11.5–15.5)
WBC: 5.8 10*3/uL (ref 4.0–10.5)
nRBC: 0 % (ref 0.0–0.2)

## 2021-08-09 LAB — ECHOCARDIOGRAM COMPLETE
AR max vel: 1.21 cm2
AV Area VTI: 1.25 cm2
AV Area mean vel: 1.23 cm2
AV Mean grad: 10.3 mmHg
AV Peak grad: 20.2 mmHg
Ao pk vel: 2.25 m/s
Area-P 1/2: 2.76 cm2
Height: 70 in
MV VTI: 2.41 cm2
S' Lateral: 2.5 cm
Weight: 2393.6 oz

## 2021-08-09 LAB — GLUCOSE, CAPILLARY
Glucose-Capillary: 134 mg/dL — ABNORMAL HIGH (ref 70–99)
Glucose-Capillary: 171 mg/dL — ABNORMAL HIGH (ref 70–99)

## 2021-08-09 LAB — LIPID PANEL
Cholesterol: 121 mg/dL (ref 0–200)
HDL: 47 mg/dL (ref >40)
LDL Cholesterol: 60 mg/dL (ref 0–99)
Total CHOL/HDL Ratio: 2.6 RATIO
Triglycerides: 70 mg/dL (ref <150)
VLDL: 14 mg/dL (ref 0–40)

## 2021-08-09 LAB — HEMOGLOBIN A1C
Hgb A1c MFr Bld: 6.8 % — ABNORMAL HIGH (ref 4.8–5.6)
Mean Plasma Glucose: 148.46 mg/dL

## 2021-08-09 LAB — HEPARIN LEVEL (UNFRACTIONATED)
Heparin Unfractionated: 0.88 IU/mL — ABNORMAL HIGH (ref 0.30–0.70)
Heparin Unfractionated: 0.56 IU/mL (ref 0.30–0.70)

## 2021-08-09 MED ORDER — ATORVASTATIN CALCIUM 40 MG PO TABS: 40.0000 mg | ORAL_TABLET | Freq: Every evening | ORAL | 0 refills | Status: DC

## 2021-08-09 MED ORDER — ASPIRIN 81 MG PO CHEW: 81.0000 mg | CHEWABLE_TABLET | Freq: Every day | ORAL | 0 refills | Status: DC

## 2021-08-09 MED ORDER — METOPROLOL TARTRATE 25 MG PO TABS: 50.0000 mg | ORAL_TABLET | Freq: Two times a day (BID) | ORAL | 0 refills | Status: DC

## 2021-08-09 MED ORDER — APIXABAN 2.5 MG PO TABS: 2.5000 mg | ORAL_TABLET | Freq: Two times a day (BID) | ORAL | Status: DC

## 2021-08-09 MED ORDER — ACETAMINOPHEN 325 MG PO TABS
650.0000 mg | ORAL_TABLET | Freq: Four times a day (QID) | ORAL | Status: DC | PRN
Start: 2021-08-09 — End: 2021-08-09

## 2021-08-09 MED ORDER — METOPROLOL TARTRATE 25 MG PO TABS
37.5000 mg | ORAL_TABLET | Freq: Two times a day (BID) | ORAL | Status: DC
Start: 2021-08-09 — End: 2021-08-09

## 2021-08-09 MED ORDER — LOSARTAN POTASSIUM 50 MG PO TABS: 50.0000 mg | ORAL_TABLET | Freq: Every day | ORAL | Status: DC

## 2021-08-09 MED ORDER — APIXABAN 2.5 MG PO TABS: 2.5000 mg | ORAL_TABLET | Freq: Two times a day (BID) | ORAL | 0 refills | Status: DC

## 2021-08-09 NOTE — TOC Benefit Eligibility Note (Signed)
Patient Advocate Encounter  Insurance verification completed.    The patient is currently admitted and upon discharge could be taking Eliquis 2.5 mg.  The current 30 day co-pay is, $47.00.   The patient is insured through [employer] Medicare Part D     Hardin Hardenbrook, CPhT Pharmacy Patient Advocate Specialist [facility] Patient Advocate Team Direct Number: [phone]  Fax: [phone]        

## 2021-08-09 NOTE — Discharge Summary (Signed)
?Physician Discharge Summary ?  ?Patient: Joshua Rogers MRN: [id number] DOB: 04-Nov-1922  ?Admit date:     08/08/2021  ?Discharge date: 08/09/21  ?Discharge Physician: Sharen Hones  ? ?PCP: Idelle Crouch, MD  ? ?Recommendations at discharge:  ? ?Follow-up with his PCP in 1 week. ?Follow-up with cardiology in 2 weeks. ? ?Discharge Diagnoses: ?Principal Problem: ?  NSTEMI (non-ST elevated myocardial infarction) ([place]) ?Active Problems: ?  HTN (hypertension) ?  HLD (hyperlipidemia) ?  Chronic kidney disease, stage 3a ([place]) ?  New onset atrial fibrillation with RVR ?  Hypothyroidism ?  BPH (benign prostatic hyperplasia) ? ?Resolved Problems: ?  * No resolved hospital problems. * ? ?Hospital Course: ?Joshua Rogers is a 88 y.o. male with medical history significant of hypertension, hyperlipidemia, diet-controlled diabetes mellitus, GERD, CKD stage IIIa, BPH, who presents with chest pain and palpitation. ?Peak troponin 392, she will also has atrial fibrillation with RVR.  Patient is placed on heparin and diltiazem drip. ?Patient has been seen by cardiology today, heart rate has been better controlled, currently placed back on metoprolol. ?Cardiology has cleared patient for discharge, they are not planning for any additional work-up.  I will continue aspirin, Lipitor. ? ?Assessment and Plan: ?* NSTEMI (non-ST elevated myocardial infarction) ([place]) ?Has been evaluated by cardiology, no additional work-up is recommended.  Continue aspirin, Lipitor and beta-blocker.  Patient will be followed by cardiology as outpatient in the near future. ?Echocardiogram showed ejection fraction 60 to 65% with grade 3 diastolic dysfunction, no regional abnormality. ? ?BPH (benign prostatic hyperplasia) ?Resume home treatment. ? ?Hypothyroidism ?TSH level normal.  Continue current dose of Synthroid ? ?New onset atrial fibrillation with RVR ?Heart rate much better.  I have increased metoprolol dose to 50 mg twice a day while discontinue  losartan. ?Also started Eliquis for stroke prevention. ? ?Chronic kidney disease, stage 3a ([place]) ?Renal function has been stable ? ?HLD (hyperlipidemia) ?Continue Lipitor. ? ?HTN (hypertension) ?Losartan discontinued, continue metoprolol with increased dose for 50 mg twice a day. ? ? ? ? ?  ? ? ?Consultants: cardiology ?Procedures performed: None  ?Disposition: Home ?Diet recommendation:  ?Discharge Diet Orders (From admission, onward)  ? ?  Start     Ordered  ? 08/09/21 0000  Diet - low sodium heart healthy       ? 08/09/21 1342  ? ?  ?  ? ?  ? ?Cardiac diet ?DISCHARGE MEDICATION: ?Allergies as of 08/09/2021   ?No Known Allergies ?  ? ?  ?Medication List  ?  ? ?STOP taking these medications   ? ?amLODipine 5 MG tablet ?Commonly known as: NORVASC ?  ?losartan 100 MG tablet ?Commonly known as: COZAAR ?  ? ?  ? ?TAKE these medications   ? ?apixaban 2.5 MG Tabs tablet ?Commonly known as: ELIQUIS ?Take 1 tablet (2.5 mg total) by mouth 2 (two) times daily. ?  ?aspirin 81 MG chewable tablet ?Chew 1 tablet (81 mg total) by mouth daily. ?  ?atorvastatin 40 MG tablet ?Commonly known as: LIPITOR ?Take 1 tablet (40 mg total) by mouth every evening. ?  ?finasteride 5 MG tablet ?Commonly known as: PROSCAR ?Take 5 mg by mouth daily. ?  ?levothyroxine 50 MCG tablet ?Commonly known as: SYNTHROID ?Take 50 mcg by mouth daily. ?  ?metoprolol tartrate 25 MG tablet ?Commonly known as: LOPRESSOR ?Take 2 tablets (50 mg total) by mouth 2 (two) times daily. ?What changed: how much to take ?  ?tamsulosin 0.4 MG Caps capsule ?Commonly known  asCaprice Kluver ?Take 0.4 mg by mouth daily. ?  ?zolpidem 10 MG tablet ?Commonly known as: AMBIEN ?TAKE 1 TABLET BY MOUTH NIGHTLY AS NEEDED FOR SLEEP ?  ? ?  ? ? Follow-up Information   ? ? Callwood, Dwayne D, MD Follow up in 2 week(s).   ?Specialties: Cardiology, Internal Medicine ?Contact information: ?[address] ?[place] [address] ?[phone] ? ? ?  ?  ? ? Idelle Crouch, MD Follow up in 1  week(s).   ?Specialty: Internal Medicine ?Contact information: ?[place] [address] ?[phone] ? ? ?  ?  ? ?  ?  ? ?  ? ?Discharge Exam: ?Filed Weights  ? 08/08/21 1006 08/08/21 1729  ?Weight: 66.2 kg 67.9 kg  ? ?General exam: Appears calm and comfortable  ?Respiratory system: Clear to auscultation. Respiratory effort normal. ?Cardiovascular system: Irregularly irregular, rate under control. No JVD, murmurs, rubs, gallops or clicks. No pedal edema. ?Gastrointestinal system: Abdomen is nondistended, soft and nontender. No organomegaly or masses felt. Normal bowel sounds heard. ?Central nervous system: Alert and oriented. No focal neurological deficits. ?Extremities: Symmetric 5 x 5 power. ?Skin: No rashes, lesions or ulcers ?Psychiatry: Judgement and insight appear normal. Mood & affect appropriate.  ? ? ?Condition at discharge: good ? ?The results of significant diagnostics from this hospitalization (including imaging, microbiology, ancillary and laboratory) are listed below for reference.  ? ?Imaging Studies: ?DG Chest Portable 1 View ? ?Result Date: 08/08/2021 ?CLINICAL DATA:  This morning Atrial fibrillation central chest pain beginning EXAM: PORTABLE CHEST - 1 VIEW COMPARISON:  10/19/2018 FINDINGS: Cardiomediastinal silhouette and pulmonary vasculature are within normal limits. Lungs are clear. Mild-to-moderate degenerative changes of the acromioclavicular joint. Degenerative changes cervical spine partially visualized. IMPRESSION: No acute cardiopulmonary process. Electronically Signed   By: Miachel Roux M.D.   On: 08/08/2021 10:37  ? ?ECHOCARDIOGRAM COMPLETE ? ?Result Date: 08/09/2021 ?   ECHOCARDIOGRAM REPORT   Patient Name:   Joshua Rogers Date of Exam: 08/09/2021 Medical Rec #:  [id number]         Height:       70.0 in Accession #:    [id number]        Weight:       149.6 lb Date of Birth:  1922-06-14          BSA:          1.845 m? Patient Age:    88 years          BP:            96/53 mmHg Patient Gender: M                 HR:           89 bpm. Exam Location:  [facility] Procedure: 2D Echo, Cardiac Doppler and Color Doppler Indications:     Atrial fibrillation I48.91  History:         Patient has prior history of Echocardiogram examinations, most                  recent 10/20/2018. Risk Factors:Diabetes, Dyslipidemia and                  Hypertension. CKD.  Sonographer:     Sherrie Sport Referring Phys:  [id number] Ivor Costa Diagnosing Phys: Yolonda Kida MD IMPRESSIONS  1. Left ventricular ejection fraction, by estimation, is 60 to 65%. The left ventricle has normal function. The left ventricle has no  regional wall motion abnormalities. There is mild concentric left ventricular hypertrophy. Left ventricular diastolic parameters are consistent with Grade III diastolic dysfunction (restrictive).  2. Right ventricular systolic function is normal. The right ventricular size is mildly enlarged. Mildly increased right ventricular wall thickness.  3. Left atrial size was mildly dilated.  4. Right atrial size was mildly dilated.  5. The mitral valve is grossly normal. Mild mitral valve regurgitation.  6. The aortic valve is grossly normal. There is mild calcification of the aortic valve. There is mild thickening of the aortic valve. Aortic valve regurgitation is mild to moderate. Mild aortic valve stenosis. FINDINGS  Left Ventricle: Left ventricular ejection fraction, by estimation, is 60 to 65%. The left ventricle has normal function. The left ventricle has no regional wall motion abnormalities. The left ventricular internal cavity size was normal in size. There is  mild concentric left ventricular hypertrophy. Left ventricular diastolic parameters are consistent with Grade III diastolic dysfunction (restrictive). Right Ventricle: The right ventricular size is mildly enlarged. Mildly increased right ventricular wall thickness. Right ventricular systolic function is normal. Left Atrium: Left atrial size was  mildly dilated. Right Atrium: Right atrial size was mildly dilated. Pericardium: There is no evidence of pericardial effusion. Mitral Valve: The mitral valve is grossly normal. Mild mitral valve regurgitation.

## 2021-08-09 NOTE — Progress Notes (Signed)
*  PRELIMINARY RESULTS* ?Echocardiogram ?2D Echocardiogram has been performed. ? ?Garlen Reinig, Dorene Sorrow ?08/09/2021, 9:32 AM ?

## 2021-08-09 NOTE — Progress Notes (Signed)
ANTICOAGULATION CONSULT NOTE ? ?Pharmacy Consult for heparin  ?Indication:  stemi and afib ? ?No Known Allergies ? ?Patient Measurements: ?Height: 5\' 10"  (177.8 cm) ?Weight: 67.9 kg (149 lb 9.6 oz) ?IBW/kg (Calculated) : 73 ?Heparin Dosing Weight: 66.2 kg  ? ?Vital Signs: ?Temp: 97.8 ?F (36.6 ?C) (03/24 0017) ?Temp Source: Oral (03/24 0017) ?BP: 89/50 (03/24 0017) ?Pulse Rate: 105 (03/23 1729) ? ?Labs: ?Recent Labs  ?  08/08/21 ?1018 08/08/21 ?1342 08/08/21 ?1840 08/08/21 ?2050 08/09/21 ?0202  ?HGB 12.8*  --   --   --  12.1*  ?HCT 40.0  --   --   --  37.3*  ?PLT 162  --   --   --  178  ?LABPROT  --   --  13.4  --   --   ?INR  --   --  1.0  --   --   ?HEPARINUNFRC  --   --   --   --  0.88*  ?CREATININE 1.38*  --   --   --   --   ?TROPONINIHS 11 159* 392* 335*  --   ? ? ? ?Estimated Creatinine Clearance: 29.4 mL/min (A) (by C-G formula based on SCr of 1.38 mg/dL (H)). ? ? ?Medical History: ?Past Medical History:  ?Diagnosis Date  ? CKD (chronic kidney disease)   ? Diabetes (H[facility])   ? HLD (hyperlipidemia)   ? Hypertension   ? ? ?Medications:  ?Per chart review, pt not on anticoagulation prior to admission ? ?Assessment: ?88 y.o. male with a past medical history of hypertension, CKD, diabetes, presented to the ED for chest pain. Pharmacy consulted for heparin dosing/monitoring.  ? ?Goal of Therapy:  ?Heparin level 0.3-0.7 units/ml ?Monitor platelets by anticoagulation protocol: Yes ? ?3/24 0202 HL 0.88, supratherapeutic ?  ?Plan:  ?Decrease heparin infusion to 900 units/hr ?Recheck HL in 8 hrs after rate change ?Continue to monitor H&H and platelets ? ?4/24, PharmD, MBA ?08/09/2021 ?2:34 AM ? ? ? ?

## 2021-08-09 NOTE — Assessment & Plan Note (Signed)
Resume home treatment. ?

## 2021-08-09 NOTE — Assessment & Plan Note (Signed)
Heart rate much better.  I have increased metoprolol dose to 50 mg twice a day while discontinue losartan. ?Also started Eliquis for stroke prevention. ?

## 2021-08-09 NOTE — Assessment & Plan Note (Signed)
Renal function has been stable 

## 2021-08-09 NOTE — Assessment & Plan Note (Signed)
Losartan discontinued, continue metoprolol with increased dose for 50 mg twice a day. ?

## 2021-08-09 NOTE — Assessment & Plan Note (Signed)
-  Continue Lipitor °

## 2021-08-09 NOTE — Progress Notes (Signed)
Assisted pt in inserting the coude catheter. Pt was not able to insert after 3 attempts. He was able to pee on the bedside commode. Amount: . Pt prefers to sleep for now and will try again later. ?

## 2021-08-09 NOTE — Hospital Course (Signed)
Joshua Rogers is a 88 y.o. male with medical history significant of hypertension, hyperlipidemia, diet-controlled diabetes mellitus, GERD, CKD stage IIIa, BPH, who presents with chest pain and palpitation. ?Peak troponin 392, she will also has atrial fibrillation with RVR.  Patient is placed on heparin and diltiazem drip. ?Patient has been seen by cardiology today, heart rate has been better controlled, currently placed back on metoprolol. ?Cardiology has cleared patient for discharge, they are not planning for any additional work-up.  I will continue aspirin, Lipitor. ?

## 2021-08-09 NOTE — Assessment & Plan Note (Signed)
TSH level normal.  Continue current dose of Synthroid ?

## 2021-08-09 NOTE — Assessment & Plan Note (Addendum)
Has been evaluated by cardiology, no additional work-up is recommended.  Continue aspirin, Lipitor and beta-blocker.  Patient will be followed by cardiology as outpatient in the near future. ?Echocardiogram showed ejection fraction 60 to 65% with grade 3 diastolic dysfunction, no regional abnormality. ?

## 2021-08-09 NOTE — Consult Note (Signed)
?[facility] CARDIOLOGY CONSULT NOTE  ? ?    ?Patient ID: ?Joshua Rogers ?MRN: [id number] ?DOB/AGE: [birth year] 88 y.o. ? ?Admit date: 08/08/2021 ?Referring Physician Dr. Blaine Hamper ?Primary Physician Dr. Doy Hutching ?Primary Cardiologist none ?Reason for Consultation new onset Afib  ? ?HPI: The patient is a 88 year old male with a past medical history of hypertension, hyperlipidemia, diabetes, CKD 3, history of CVA who presented to [facility] ED 08/08/2021 with chest discomfort.  He was found to be in atrial fibrillation in the ED.  Cardiology is consulted for further assistance. ? ?The patient states he was fixing breakfast for his wife yesterday morning when he had a sudden onset of chest discomfort that went to both of his shoulders with associated heart racing, shortness of breath and weakness in his legs.  He says this is never happened before.  He was given IV metoprolol x1 by EMS and IV diltiazem x1 in the ED with his heart rate transiently down to 90 but back up to 120-130.  Eventually started on a diltiazem drip which he remained on overnight with good control of his heart rate while still in atrial fibrillation.   ? ?During interview this morning, the patient states he feels much better.  He is accompanied by his son. Denies further episodes of chest pain, but occasionally describes a "fluttering" in his chest, but feels well.  He has not ambulated much since he has been here due to the IV lines and Foley.  The patient is normally able to perform all his ADLs at home and prepares meals for himself and his wife. ? ?Vitals on admission are notable for a blood pressure peaking at 184/78, with lows this morning down to 77/50.  Heart rate peaked at 135 well-controlled in the 80s-90s this morning. ? ?Labs are notable for a potassium of 4.1, BUN 32, creatinine 1.47, EGFR 43 (baseline around 1.3, 47).  Troponin mildly elevated to [phone] H&H 12.1-37.3.  Platelets 175.  A1c 6.8.  TSH 1.8 total cholesterol 121, HDL 47, LDL  360, triglycerides 70, VLDL 14. ? ?Review of systems complete and found to be negative unless listed above  ? ? ? ?Past Medical History:  ?Diagnosis Date  ? CKD (chronic kidney disease)   ? Diabetes ([place])   ? HLD (hyperlipidemia)   ? Hypertension   ?  ?Past Surgical History:  ?Procedure Laterality Date  ? APPENDECTOMY    ? CATARACT EXTRACTION    ?  ?Medications Prior to Admission  ?Medication Sig Dispense Refill Last Dose  ? amLODipine (NORVASC) 5 MG tablet Take 1 tablet (5 mg total) by mouth daily. 30 tablet 1 08/07/2021  ? finasteride (PROSCAR) 5 MG tablet Take 5 mg by mouth daily.   08/07/2021  ? levothyroxine (SYNTHROID) 50 MCG tablet Take 50 mcg by mouth daily.   08/07/2021  ? losartan (COZAAR) 100 MG tablet TAKE 1 TABLET BY MOUTH ONCE DAILY   08/07/2021  ? metoprolol tartrate (LOPRESSOR) 25 MG tablet Take 1 tablet (25 mg total) by mouth 2 (two) times daily. 60 tablet 0 08/07/2021  ? tamsulosin (FLOMAX) 0.4 MG CAPS capsule Take 0.4 mg by mouth daily.   08/07/2021  ? zolpidem (AMBIEN) 10 MG tablet TAKE 1 TABLET BY MOUTH NIGHTLY AS NEEDED FOR SLEEP   08/07/2021  ? ?Social History  ? ?Socioeconomic History  ? Marital status: Married  ?  Spouse name: Not on file  ? Number of children: Not on file  ? Years of education: Not on file  ?  Highest education level: Not on file  ?Occupational History  ? Not on file  ?Tobacco Use  ? Smoking status: Former  ? Smokeless tobacco: Never  ?Substance and Sexual Activity  ? Alcohol use: Yes  ? Drug use: Not on file  ? Sexual activity: Not on file  ?Other Topics Concern  ? Not on file  ?Social History Narrative  ? Not on file  ? ?Social Determinants of Health  ? ?Financial Resource Strain: Not on file  ?Food Insecurity: Not on file  ?Transportation Needs: Not on file  ?Physical Activity: Not on file  ?Stress: Not on file  ?Social Connections: Not on file  ?Intimate Partner Violence: Not on file  ?  ?Family History  ?Problem Relation Age of Onset  ? Stroke Father   ? Lung cancer Brother    ? Stroke Brother   ?  ? ? ?Review of systems complete and found to be negative unless listed above  ? ? ?PHYSICAL EXAM ?General: Pleasant elderly and frail Caucasian male, well nourished, in no acute distress.  Sitting upright eating breakfast in PCU bed, son at bedside ?HEENT:  Normocephalic and atraumatic. ?Neck:  No JVD.  ?Lungs: Normal respiratory effort on room air. Clear bilaterally to auscultation. No wheezes, crackles, rhonchi.  ?Heart: Irregularly irregular rhythm with controlled rate. Normal S1 and soft S2.  3/6 systolic murmur heard throughout.  Radial & DP pulses 2+ bilaterally. ?Abdomen: Non-distended appearing.  ?Msk: Normal strength and tone for age. ?Extremities: Warm and well perfused. No clubbing, cyanosis.  No peripheral edema.  ?Neuro: Alert and oriented X 3. ?Psych:  Answers questions appropriately.  ? ?Labs: ?  ?Lab Results  ?Component Value Date  ? WBC 5.8 08/09/2021  ? HGB 12.1 (L) 08/09/2021  ? HCT 37.3 (L) 08/09/2021  ? MCV 91.9 08/09/2021  ? PLT 178 08/09/2021  ?  ?Recent Labs  ?Lab 08/08/21 ?1018 08/09/21 ?0202  ?NA 139 139  ?K 4.3 4.1  ?CL 110 110  ?CO2 21* 22  ?BUN 26* 32*  ?CREATININE 1.38* 1.47*  ?CALCIUM 8.2* 8.4*  ?PROT 6.2*  --   ?BILITOT 0.8  --   ?ALKPHOS 82  --   ?ALT 34  --   ?AST 39  --   ?GLUCOSE 190* 159*  ? ?Lab Results  ?Component Value Date  ? CKTOTAL 44 04/17/2012  ? CKMB 1.1 04/17/2012  ? TROPONINI <0.03 10/19/2018  ?  ?Lab Results  ?Component Value Date  ? CHOL 121 08/09/2021  ? ?Lab Results  ?Component Value Date  ? HDL 47 08/09/2021  ? ?Lab Results  ?Component Value Date  ? [place] 60 08/09/2021  ? ?Lab Results  ?Component Value Date  ? TRIG 70 08/09/2021  ? ?Lab Results  ?Component Value Date  ? CHOLHDL 2.6 08/09/2021  ? ?No results found for: LDLDIRECT  ?  ?Radiology: DG Chest Portable 1 View ? ?Result Date: 08/08/2021 ?CLINICAL DATA:  This morning Atrial fibrillation central chest pain beginning EXAM: PORTABLE CHEST - 1 VIEW COMPARISON:  10/19/2018 FINDINGS:  Cardiomediastinal silhouette and pulmonary vasculature are within normal limits. Lungs are clear. Mild-to-moderate degenerative changes of the acromioclavicular joint. Degenerative changes cervical spine partially visualized. IMPRESSION: No acute cardiopulmonary process. Electronically Signed   By: Miachel Roux M.D.   On: 08/08/2021 10:37   ? ?ECHO 10/20/2018 ? 1. The left ventricle has normal systolic function with an ejection  ?fraction of 60-65%. The cavity size was normal. Left ventricular diastolic  ?parameters were normal.  ?  2. The right ventricle has normal systolic function. The cavity was  ?normal. There is no increase in right ventricular wall thickness.  ? 3. Mitral valve regurgitation is mild to moderate by color flow Doppler.  ? 4. Tricuspid valve regurgitation is mild-moderate.  ? 5. The aortic valve is tricuspid. Mild thickening of the aortic valve.  ?Mild calcification of the aortic valve. Aortic valve regurgitation is mild  ?by color flow Doppler. Mild stenosis of the aortic valve.  ? ?TELEMETRY reviewed by me: Atrial fibrillation with heart rates mostly 80s-90s with transient peaks to low 100s. ? ?EKG reviewed by me: Atrial fibrillation rate 111 nonspecific ST changes nonspecific IVCD. ? ?ASSESSMENT AND PLAN:  ?The patient is a 88 year old male with a past medical history of hypertension, hyperlipidemia, diabetes, CKD 3, history of CVA who presented to [facility] ED 08/08/2021 with chest discomfort.  He was found to be in atrial fibrillation in the ED.  Cardiology is consulted for further assistance. ? ?#New onset atrial fibrillation with RVR ?#Elevated troponin likely 2/2 demand ischemia ?#Mild aortic stenosis by echo 10/2018 ?The patient presented with acute onset of upper chest tightness with associated leg weakness and palpitations.  He was found to be in atrial fibrillation with RVR in the ED with rates peaking in the 130s.  His troponin is mildly elevated with peak at 392, likely 2/2 demand ischemia  from his rapid rate and not ACS.  The patient states he has not had any further chest discomfort today, citing occasional chest "fluttering." ?-S/p IV metoprolol x1, diltiazem x1, diltiazem gtt overnight wi

## 2021-08-27 ENCOUNTER — Encounter: Payer: Self-pay | Admitting: Emergency Medicine

## 2021-08-27 ENCOUNTER — Other Ambulatory Visit: Payer: Self-pay

## 2021-08-27 ENCOUNTER — Emergency Department: Payer: Medicare Other

## 2021-08-27 ENCOUNTER — Inpatient Hospital Stay
Admission: EM | Admit: 2021-08-27 | Discharge: 2021-08-30 | DRG: 202 | Disposition: A | Discharge status: Home Health | Payer: Medicare Other | Attending: Family Medicine | Admitting: Family Medicine

## 2021-08-27 DIAGNOSIS — Z7982 Long term (current) use of aspirin: Secondary | ICD-10-CM

## 2021-08-27 DIAGNOSIS — R0602 Shortness of breath: Secondary | ICD-10-CM | POA: Insufficient documentation

## 2021-08-27 DIAGNOSIS — N390 Urinary tract infection, site not specified: Secondary | ICD-10-CM | POA: Diagnosis present

## 2021-08-27 DIAGNOSIS — I248 Other forms of acute ischemic heart disease: Secondary | ICD-10-CM | POA: Diagnosis present

## 2021-08-27 DIAGNOSIS — Z79890 Hormone replacement therapy (postmenopausal): Secondary | ICD-10-CM | POA: Diagnosis not present

## 2021-08-27 DIAGNOSIS — I252 Old myocardial infarction: Secondary | ICD-10-CM | POA: Diagnosis not present

## 2021-08-27 DIAGNOSIS — Z823 Family history of stroke: Secondary | ICD-10-CM

## 2021-08-27 DIAGNOSIS — Z79899 Other long term (current) drug therapy: Secondary | ICD-10-CM

## 2021-08-27 DIAGNOSIS — R042 Hemoptysis: Secondary | ICD-10-CM | POA: Diagnosis present

## 2021-08-27 DIAGNOSIS — E1122 Type 2 diabetes mellitus with diabetic chronic kidney disease: Secondary | ICD-10-CM | POA: Diagnosis present

## 2021-08-27 DIAGNOSIS — Z7901 Long term (current) use of anticoagulants: Secondary | ICD-10-CM | POA: Diagnosis not present

## 2021-08-27 DIAGNOSIS — R7989 Other specified abnormal findings of blood chemistry: Secondary | ICD-10-CM | POA: Diagnosis present

## 2021-08-27 DIAGNOSIS — Z20822 Contact with and (suspected) exposure to covid-19: Secondary | ICD-10-CM | POA: Diagnosis present

## 2021-08-27 DIAGNOSIS — I4891 Unspecified atrial fibrillation: Secondary | ICD-10-CM

## 2021-08-27 DIAGNOSIS — I10 Essential (primary) hypertension: Secondary | ICD-10-CM | POA: Diagnosis not present

## 2021-08-27 DIAGNOSIS — I5033 Acute on chronic diastolic (congestive) heart failure: Secondary | ICD-10-CM | POA: Insufficient documentation

## 2021-08-27 DIAGNOSIS — Z8673 Personal history of transient ischemic attack (TIA), and cerebral infarction without residual deficits: Secondary | ICD-10-CM

## 2021-08-27 DIAGNOSIS — E785 Hyperlipidemia, unspecified: Secondary | ICD-10-CM | POA: Diagnosis present

## 2021-08-27 DIAGNOSIS — I482 Chronic atrial fibrillation, unspecified: Secondary | ICD-10-CM | POA: Diagnosis present

## 2021-08-27 DIAGNOSIS — R748 Abnormal levels of other serum enzymes: Secondary | ICD-10-CM | POA: Diagnosis not present

## 2021-08-27 DIAGNOSIS — N400 Enlarged prostate without lower urinary tract symptoms: Secondary | ICD-10-CM | POA: Diagnosis present

## 2021-08-27 DIAGNOSIS — I2510 Atherosclerotic heart disease of native coronary artery without angina pectoris: Secondary | ICD-10-CM | POA: Diagnosis present

## 2021-08-27 DIAGNOSIS — I130 Hypertensive heart and chronic kidney disease with heart failure and stage 1 through stage 4 chronic kidney disease, or unspecified chronic kidney disease: Secondary | ICD-10-CM | POA: Diagnosis present

## 2021-08-27 DIAGNOSIS — B9620 Unspecified Escherichia coli [E. coli] as the cause of diseases classified elsewhere: Secondary | ICD-10-CM | POA: Diagnosis present

## 2021-08-27 DIAGNOSIS — R339 Retention of urine, unspecified: Secondary | ICD-10-CM | POA: Diagnosis not present

## 2021-08-27 DIAGNOSIS — N1831 Chronic kidney disease, stage 3a: Secondary | ICD-10-CM | POA: Diagnosis not present

## 2021-08-27 DIAGNOSIS — E039 Hypothyroidism, unspecified: Secondary | ICD-10-CM | POA: Diagnosis not present

## 2021-08-27 DIAGNOSIS — Z801 Family history of malignant neoplasm of trachea, bronchus and lung: Secondary | ICD-10-CM

## 2021-08-27 DIAGNOSIS — I5032 Chronic diastolic (congestive) heart failure: Secondary | ICD-10-CM | POA: Diagnosis present

## 2021-08-27 DIAGNOSIS — Z87891 Personal history of nicotine dependence: Secondary | ICD-10-CM

## 2021-08-27 DIAGNOSIS — Z66 Do not resuscitate: Secondary | ICD-10-CM | POA: Diagnosis present

## 2021-08-27 DIAGNOSIS — J40 Bronchitis, not specified as acute or chronic: Principal | ICD-10-CM

## 2021-08-27 DIAGNOSIS — J209 Acute bronchitis, unspecified: Principal | ICD-10-CM | POA: Diagnosis present

## 2021-08-27 DIAGNOSIS — N401 Enlarged prostate with lower urinary tract symptoms: Secondary | ICD-10-CM | POA: Diagnosis present

## 2021-08-27 DIAGNOSIS — R778 Other specified abnormalities of plasma proteins: Secondary | ICD-10-CM | POA: Diagnosis present

## 2021-08-27 LAB — COMPREHENSIVE METABOLIC PANEL
ALT: 20 U/L (ref 0–44)
AST: 28 U/L (ref 15–41)
Albumin: 2.7 g/dL — ABNORMAL LOW (ref 3.5–5.0)
Alkaline Phosphatase: 81 U/L (ref 38–126)
Anion gap: 6 (ref 5–15)
BUN: 16 mg/dL (ref 8–23)
CO2: 24 mmol/L (ref 22–32)
Calcium: 8.3 mg/dL — ABNORMAL LOW (ref 8.9–10.3)
Chloride: 104 mmol/L (ref 98–111)
Creatinine, Ser: 1.37 mg/dL — ABNORMAL HIGH (ref 0.61–1.24)
GFR, Estimated: 47 mL/min — ABNORMAL LOW (ref >60)
Glucose, Bld: 202 mg/dL — ABNORMAL HIGH (ref 70–99)
Potassium: 4.5 mmol/L (ref 3.5–5.1)
Sodium: 134 mmol/L — ABNORMAL LOW (ref 135–145)
Total Bilirubin: 0.5 mg/dL (ref 0.3–1.2)
Total Protein: 6.5 g/dL (ref 6.5–8.1)

## 2021-08-27 LAB — PROTIME-INR
INR: 1.1 (ref 0.8–1.2)
Prothrombin Time: 13.7 seconds (ref 11.4–15.2)

## 2021-08-27 LAB — URINALYSIS, ROUTINE W REFLEX MICROSCOPIC
Bilirubin Urine: NEGATIVE
Glucose, UA: NEGATIVE mg/dL
Hgb urine dipstick: NEGATIVE
Ketones, ur: NEGATIVE mg/dL
Nitrite: NEGATIVE
Protein, ur: NEGATIVE mg/dL
Specific Gravity, Urine: 1.004 — ABNORMAL LOW (ref 1.005–1.030)
pH: 6 (ref 5.0–8.0)

## 2021-08-27 LAB — CBC WITH DIFFERENTIAL/PLATELET
Abs Immature Granulocytes: 0.14 10*3/uL — ABNORMAL HIGH (ref 0.00–0.07)
Basophils Absolute: 0 10*3/uL (ref 0.0–0.1)
Basophils Relative: 1 %
Eosinophils Absolute: 0.1 10*3/uL (ref 0.0–0.5)
Eosinophils Relative: 1 %
HCT: 33.3 % — ABNORMAL LOW (ref 39.0–52.0)
Hemoglobin: 10.6 g/dL — ABNORMAL LOW (ref 13.0–17.0)
Immature Granulocytes: 2 %
Lymphocytes Relative: 8 %
Lymphs Abs: 0.6 10*3/uL — ABNORMAL LOW (ref 0.7–4.0)
MCH: 29.7 pg (ref 26.0–34.0)
MCHC: 31.8 g/dL (ref 30.0–36.0)
MCV: 93.3 fL (ref 80.0–100.0)
Monocytes Absolute: 0.6 10*3/uL (ref 0.1–1.0)
Monocytes Relative: 8 %
Neutro Abs: 6.4 10*3/uL (ref 1.7–7.7)
Neutrophils Relative %: 80 %
Platelets: 244 10*3/uL (ref 150–400)
RBC: 3.57 MIL/uL — ABNORMAL LOW (ref 4.22–5.81)
RDW: 14.6 % (ref 11.5–15.5)
WBC: 7.9 10*3/uL (ref 4.0–10.5)
nRBC: 0 % (ref 0.0–0.2)

## 2021-08-27 LAB — STREP PNEUMONIAE URINARY ANTIGEN: Strep Pneumo Urinary Antigen: NEGATIVE

## 2021-08-27 LAB — CBG MONITORING, ED
Glucose-Capillary: 130 mg/dL — ABNORMAL HIGH (ref 70–99)
Glucose-Capillary: 312 mg/dL — ABNORMAL HIGH (ref 70–99)

## 2021-08-27 LAB — EXPECTORATED SPUTUM ASSESSMENT W GRAM STAIN, RFLX TO RESP C

## 2021-08-27 LAB — TROPONIN I (HIGH SENSITIVITY)
Troponin I (High Sensitivity): 47 ng/L — ABNORMAL HIGH (ref <18)
Troponin I (High Sensitivity): 57 ng/L — ABNORMAL HIGH (ref <18)
Troponin I (High Sensitivity): 68 ng/L — ABNORMAL HIGH (ref <18)
Troponin I (High Sensitivity): 65 ng/L — ABNORMAL HIGH (ref <18)

## 2021-08-27 LAB — RESP PANEL BY RT-PCR (FLU A&B, COVID) ARPGX2
Influenza A by PCR: NEGATIVE
Influenza B by PCR: NEGATIVE
SARS Coronavirus 2 by RT PCR: NEGATIVE

## 2021-08-27 LAB — APTT: aPTT: 35 seconds (ref 24–36)

## 2021-08-27 LAB — PROCALCITONIN: Procalcitonin: <0.1 ng/mL

## 2021-08-27 LAB — BRAIN NATRIURETIC PEPTIDE: B Natriuretic Peptide: 419.2 pg/mL — ABNORMAL HIGH (ref 0.0–100.0)

## 2021-08-27 MED ORDER — ZOLPIDEM TARTRATE 5 MG PO TABS: 5.0000 mg | ORAL_TABLET | Freq: Every evening | ORAL | Status: DC | PRN

## 2021-08-27 MED ORDER — DILTIAZEM HCL-DEXTROSE 125-5 MG/125ML-% IV SOLN (PREMIX)
5.0000 mg/h | INTRAVENOUS | Status: DC
  Administered 2021-08-27: 7.5 mg/h via INTRAVENOUS
  Administered 2021-08-27 – 2021-08-28 (×2): 5 mg/h via INTRAVENOUS
  Filled 2021-08-27 (×2): qty 125

## 2021-08-27 MED ORDER — SODIUM CHLORIDE 0.9 % IV SOLN
1.0000 g | INTRAVENOUS | Status: DC
  Administered 2021-08-28 – 2021-08-29 (×2): 1 g via INTRAVENOUS
  Filled 2021-08-27 (×2): qty 10

## 2021-08-27 MED ORDER — ACETAMINOPHEN 325 MG PO TABS: 650.0000 mg | ORAL_TABLET | Freq: Four times a day (QID) | ORAL | Status: DC | PRN

## 2021-08-27 MED ORDER — DM-GUAIFENESIN ER 30-600 MG PO TB12
1.0000 | ORAL_TABLET | Freq: Two times a day (BID) | ORAL | Status: DC | PRN
Start: 2021-08-27 — End: 2021-08-30

## 2021-08-27 MED ORDER — METOPROLOL TARTRATE 25 MG PO TABS
50.0000 mg | ORAL_TABLET | Freq: Once | ORAL | Status: AC
  Administered 2021-08-27: 50 mg via ORAL
  Filled 2021-08-27: qty 2

## 2021-08-27 MED ORDER — ALBUTEROL SULFATE (2.5 MG/3ML) 0.083% IN NEBU
2.5000 mg | INHALATION_SOLUTION | RESPIRATORY_TRACT | Status: DC | PRN
Start: 2021-08-27 — End: 2021-08-30

## 2021-08-27 MED ORDER — IOHEXOL 350 MG/ML SOLN
75.0000 mL | Freq: Once | INTRAVENOUS | Status: AC | PRN
  Administered 2021-08-27: 75 mL via INTRAVENOUS
  Filled 2021-08-27: qty 75

## 2021-08-27 MED ORDER — LEVOTHYROXINE SODIUM 50 MCG PO TABS
50.0000 ug | ORAL_TABLET | Freq: Every day | ORAL | Status: DC
  Administered 2021-08-28 – 2021-08-30 (×3): 50 ug via ORAL
  Filled 2021-08-27 (×3): qty 1

## 2021-08-27 MED ORDER — FINASTERIDE 5 MG PO TABS
5.0000 mg | ORAL_TABLET | Freq: Every day | ORAL | Status: DC
  Administered 2021-08-28 – 2021-08-30 (×3): 5 mg via ORAL
  Filled 2021-08-27 (×3): qty 1

## 2021-08-27 MED ORDER — TAMSULOSIN HCL 0.4 MG PO CAPS
0.4000 mg | ORAL_CAPSULE | Freq: Every day | ORAL | Status: DC
  Administered 2021-08-28 – 2021-08-30 (×3): 0.4 mg via ORAL
  Filled 2021-08-27 (×3): qty 1

## 2021-08-27 MED ORDER — FUROSEMIDE 20 MG PO TABS
20.0000 mg | ORAL_TABLET | Freq: Every day | ORAL | Status: DC
  Administered 2021-08-27 – 2021-08-30 (×4): 20 mg via ORAL
  Filled 2021-08-27 (×4): qty 1

## 2021-08-27 MED ORDER — AZITHROMYCIN 500 MG PO TABS
500.0000 mg | ORAL_TABLET | Freq: Once | ORAL | Status: AC
Start: 2021-08-27 — End: 2021-08-27
  Administered 2021-08-27: 500 mg via ORAL
  Filled 2021-08-27: qty 1

## 2021-08-27 MED ORDER — SODIUM CHLORIDE 0.9 % IV SOLN
1.0000 g | Freq: Once | INTRAVENOUS | Status: AC
  Administered 2021-08-27: 1 g via INTRAVENOUS
  Filled 2021-08-27: qty 10

## 2021-08-27 MED ORDER — ONDANSETRON HCL 4 MG/2ML IJ SOLN
4.0000 mg | Freq: Three times a day (TID) | INTRAMUSCULAR | Status: DC | PRN
Start: 2021-08-27 — End: 2021-08-30

## 2021-08-27 MED ORDER — LEVOTHYROXINE SODIUM 50 MCG PO TABS: 50.0000 ug | ORAL_TABLET | Freq: Every day | ORAL | Status: DC

## 2021-08-27 MED ORDER — METHYLPREDNISOLONE SODIUM SUCC 125 MG IJ SOLR
125.0000 mg | Freq: Once | INTRAMUSCULAR | Status: AC
  Administered 2021-08-27: 125 mg via INTRAVENOUS
  Filled 2021-08-27: qty 2

## 2021-08-27 MED ORDER — ATORVASTATIN CALCIUM 20 MG PO TABS
40.0000 mg | ORAL_TABLET | Freq: Every evening | ORAL | Status: DC
  Administered 2021-08-27 – 2021-08-29 (×3): 40 mg via ORAL
  Filled 2021-08-27 (×3): qty 2

## 2021-08-27 MED ORDER — INSULIN ASPART 100 UNIT/ML IJ SOLN
0.0000 [IU] | Freq: Three times a day (TID) | INTRAMUSCULAR | Status: DC
  Administered 2021-08-28: 7 [IU] via SUBCUTANEOUS
  Administered 2021-08-28: 5 [IU] via SUBCUTANEOUS
  Administered 2021-08-28: 1 [IU] via SUBCUTANEOUS
  Administered 2021-08-29: 3 [IU] via SUBCUTANEOUS
  Administered 2021-08-29 – 2021-08-30 (×3): 1 [IU] via SUBCUTANEOUS
  Filled 2021-08-27 (×8): qty 1

## 2021-08-27 MED ORDER — HYDRALAZINE HCL 20 MG/ML IJ SOLN: 5.0000 mg | INTRAMUSCULAR | Status: DC | PRN

## 2021-08-27 MED ORDER — IPRATROPIUM-ALBUTEROL 0.5-2.5 (3) MG/3ML IN SOLN
3.0000 mL | RESPIRATORY_TRACT | Status: DC
  Administered 2021-08-27 – 2021-08-28 (×3): 3 mL via RESPIRATORY_TRACT
  Filled 2021-08-27 (×3): qty 3

## 2021-08-27 MED ORDER — METOPROLOL TARTRATE 5 MG/5ML IV SOLN
2.5000 mg | Freq: Once | INTRAVENOUS | Status: AC
  Administered 2021-08-27: 2.5 mg via INTRAVENOUS
  Filled 2021-08-27: qty 5

## 2021-08-27 MED ORDER — AZITHROMYCIN 250 MG PO TABS
500.0000 mg | ORAL_TABLET | Freq: Every day | ORAL | Status: DC
  Administered 2021-08-28 – 2021-08-30 (×3): 500 mg via ORAL
  Filled 2021-08-27 (×3): qty 2

## 2021-08-27 MED ORDER — METOPROLOL TARTRATE 50 MG PO TABS
50.0000 mg | ORAL_TABLET | Freq: Two times a day (BID) | ORAL | Status: DC
  Administered 2021-08-28 – 2021-08-29 (×3): 50 mg via ORAL
  Filled 2021-08-27 (×3): qty 1

## 2021-08-27 MED ORDER — METOPROLOL TARTRATE 5 MG/5ML IV SOLN
2.5000 mg | INTRAVENOUS | Status: DC | PRN
Start: 2021-08-27 — End: 2021-08-30
  Administered 2021-08-27 – 2021-08-30 (×2): 2.5 mg via INTRAVENOUS
  Filled 2021-08-27 (×2): qty 5

## 2021-08-27 MED ORDER — INSULIN ASPART 100 UNIT/ML IJ SOLN
0.0000 [IU] | Freq: Every day | INTRAMUSCULAR | Status: DC
  Administered 2021-08-27: 5 [IU] via SUBCUTANEOUS
  Filled 2021-08-27: qty 1

## 2021-08-27 NOTE — ED Provider Notes (Signed)
? ?[facility] ?Provider Note ? ? ? Event Date/Time  ? First MD Initiated Contact with Patient 08/27/21 1040   ?  (approximate) ? ? ?History  ? ?URI ? ? ?HPI ? ?Joshua Rogers is a 88 y.o. male with CKD, hypertension, hyperlipidemia, A-fib who comes in with chest and nasal congestion for the past 4 to 5 days.  Patient reports initially have a little bit of a sore throat but then this traveling up into his face and having a lot of nasal congestion and chest congestion with a little bit of associated shortness of breath.  He does report coughing up a little bit of mucus that is pink-tinged with maybe a little bit of shortness of breath.  Patient does have some new leg swelling that he is not sure if he has had that previously.  He reports that he intermittently has been taking the Eliquis that was prescribed to him but not consistently.  He denies any abdominal pain or chest pain. ? ? ?On review of records patient was just admitted from 3/23 to 3/24 for A-fib with RVR.  His troponin was elevated to 392 but it was thought to be demand secondary to A-fib so no other interventions were done.  Patient was increased on metoprolol 50 mg twice a day we will discontinue losartan and started on Eliquis. ? ? ?Physical Exam  ? ?Triage Vital Signs: ?ED Triage Vitals [08/27/21 1047]  ?Enc Vitals Group  ?   BP   ?   Pulse   ?   Resp   ?   Temp   ?   Temp src   ?   SpO2   ?   Weight 145 lb (65.8 kg)  ?   Height 5\' 10"  (1.778 m)  ?   Head Circumference   ?   Peak Flow   ?   Pain Score 0  ?   Pain Loc   ?   Pain Edu?   ?   Excl. in [place]?   ? ? ?Most recent vital signs: ?Vitals:  ? 08/27/21 1058  ?BP: 116/68  ?Pulse: 70  ?Resp: 18  ?Temp: 98 ?F (36.7 ?C)  ?SpO2: 98%  ? ? ? ?General: Awake, no distress.  ?CV:  Good peripheral perfusion.  ?Resp:  Normal effort.  Clear lungs ?Abd:  No distention.  Soft and nontender ?Other:  1+ edema in his bilateral legs ?Patient has a lot of nasal congestion. ? ?ED Results /  Procedures / Treatments  ? ?Labs ?(all labs ordered are listed, but only abnormal results are displayed) ?Labs Reviewed  ?CBC WITH DIFFERENTIAL/PLATELET - Abnormal; Notable for the following components:  ?    Result Value  ? RBC 3.57 (*)   ? Hemoglobin 10.6 (*)   ? HCT 33.3 (*)   ? Lymphs Abs 0.6 (*)   ? Abs Immature Granulocytes 0.14 (*)   ? All other components within normal limits  ?COMPREHENSIVE METABOLIC PANEL - Abnormal; Notable for the following components:  ? Sodium 134 (*)   ? Glucose, Bld 202 (*)   ? Creatinine, Ser 1.37 (*)   ? Calcium 8.3 (*)   ? Albumin 2.7 (*)   ? GFR, Estimated 47 (*)   ? All other components within normal limits  ?BRAIN NATRIURETIC PEPTIDE - Abnormal; Notable for the following components:  ? B Natriuretic Peptide 419.2 (*)   ? All other components within normal limits  ?TROPONIN I (HIGH SENSITIVITY) - Abnormal;  Notable for the following components:  ? Troponin I (High Sensitivity) 47 (*)   ? All other components within normal limits  ?PROTIME-INR  ?APTT  ?URINALYSIS, ROUTINE W REFLEX MICROSCOPIC  ?TROPONIN I (HIGH SENSITIVITY)  ? ? ? ?EKG ? ?My interpretation of EKG: ? ?Atrial fibrillation with a rate 118 without any ST elevation or T wave inversions, normal intervals ? ?RADIOLOGY ?I have reviewed the xray personally and see no obvious evidence of fluid in the lungs.  Maybe a little bit of bilateral bibasilar atelectasis ? ?PROCEDURES: ? ?Critical Care performed: Yes, see critical care procedure note(s) ? ?.1-3 Lead EKG Interpretation ?Performed by: Vanessa Longfellow, MD ?Authorized by: Vanessa [place]woody, MD  ? ?  Interpretation: abnormal   ?  ECG rate:  120 ?  ECG rate assessment: tachycardic   ?  Rhythm: atrial fibrillation   ?  Ectopy: none   ?  Conduction: normal   ?.Critical Care ?Performed by: Vanessa [place], MD ?Authorized by: Vanessa [place], MD  ? ?Critical care provider statement:  ?  Critical care time (minutes):  30 ?  Critical care was necessary to treat or prevent imminent or  life-threatening deterioration of the following conditions:  Sepsis ?  Critical care was time spent personally by me on the following activities:  Development of treatment plan with patient or surrogate, discussions with consultants, evaluation of patient's response to treatment, examination of patient, ordering and review of laboratory studies, ordering and review of radiographic studies, ordering and performing treatments and interventions, pulse oximetry, re-evaluation of patient's condition and review of old charts ? ? ?MEDICATIONS ORDERED IN ED: ?Medications  ?methylPREDNISolone sodium succinate (SOLU-MEDROL) 125 mg/2 mL injection 125 mg (has no administration in time range)  ?cefTRIAXone (ROCEPHIN) 1 g in sodium chloride 0.9 % 100 mL IVPB (has no administration in time range)  ?azithromycin (ZITHROMAX) tablet 500 mg (has no administration in time range)  ?iohexol (OMNIPAQUE) 350 MG/ML injection 75 mL (75 mLs Intravenous Contrast Given 08/27/21 1415)  ?metoprolol tartrate (LOPRESSOR) injection 2.5 mg (2.5 mg Intravenous Given 08/27/21 1451)  ?metoprolol tartrate (LOPRESSOR) tablet 50 mg (50 mg Oral Given 08/27/21 1450)  ? ? ? ?IMPRESSION / MDM / ASSESSMENT AND PLAN / ED COURSE  ?I reviewed the triage vital signs and the nursing notes. ?             ?               ? ?\Patient comes in with a coughing, congestion in his nose what sounds like a possible sinus infection.  Labs ordered to evaluate further.  ? ?Patient's hemoglobin is dropped 10.6 down from 12.1.  Patient denies any black tarry stool but does report a little bit of blood-tinged sputum. ?CMP shows creatinine that is similar to prior ?Troponin is downtrending ?BNP so slightly elevated ? ?Patient's heart rate went into A-fib with RVR.  He was given some IV metoprolol and his oral metoprolol that he missed this morning.  Suspect that is related to his sickness as well as missing medications. ? ?Repeat troponin is uptrending by scan I suspect this is more  likely demand. ? ?CT imaging concerning for some chronic bronchitis mucous plugging.  We attempted to intubate patient and oxygen levels went down to 91% and patient's heart rates are still elevated.  Discussed with patient going home versus admission he felt more comfortable with admission therefore given patient's age we will cover him with antibiotics for my concern for  pneumonia, bronchitis and will start on some steroids.  He will need chest physiotherapy and careful watch of his heart rate ? ?FINAL CLINICAL IMPRESSION(S) / ED DIAGNOSES  ? ?Final diagnoses:  ?Bronchitis  ?Atrial fibrillation with rapid ventricular response ([place])  ? ? ? ?Rx / DC Orders  ? ?ED Discharge Orders   ? ? None  ? ?  ? ? ? ?Note:  This document was prepared using Dragon voice recognition software and may include unintentional dictation errors. ?  ?Vanessa [place], MD ?08/27/21 1545 ? ?

## 2021-08-27 NOTE — ED Notes (Signed)
Transport Requested  ?

## 2021-08-27 NOTE — H&P (Signed)
?History and Physical  ? ? ?Joshua Rogers [id number] DOB: Jul 17, 1922 DOA: 08/27/2021 ? ?Referring MD/NP/PA:  ? ?PCP: Idelle Crouch, MD  ? ?Patient coming from:  The patient is coming from home.  At baseline, pt is independent for most of ADL.       ? ?Chief Complaint: cough and SOB ? ?HPI: Joshua Rogers is a 88 y.o. male with medical history significant of  hypertension, hyperlipidemia, diet-controlled diabetes mellitus, GERD, CKD stage IIIa, BPH, dCHF, CAD, NSTEMI, A fib on Eliquis, who present with cough and SOB. ? ?Patient was recently hospitalized from 3/23 - 3/24 due to non-STEMI.  Patient also was found to have atrial fibrillation and started on Eliquis. Patient states that in the past several days, he has been having cough and shortness of breath which has been progressively worsening.  He coughs up yellow-colored sputum mixed with pink-colored blood.  Patient does not have chest pain, fever or chills.  Patient denies nausea vomiting, diarrhea or abdominal pain.  No symptoms of UTI.  Patient has generalized weakness. ? ?Data Reviewed and ED Course: pt was found to have WBC 7.9, INR 1.1, troponin level 47, 57, negative COVID PCR, stable renal function, positive urinalysis (hazy appearance, small amount of leukocyte, rare bacteria, WBC 21-50), BNP 419, temperature normal, blood pressure 168/88, heart rate 110-130s, RR 18, oxygen saturation 94% on room air.  Chest x-ray showed bilateral basilar atelectasis.  Patient is admitted to PCU as inpatient ? ?CT angiogram: ?Negative for significant acute pulmonary embolus by CTA. ?  ?Extensive thoracic aortic atherosclerosis without acute aortic ?process, aneurysm or dissection. ?  ?Native coronary atherosclerosis and aortic valve calcifications. ?  ?Background biapical pleuroparenchymal scarring and paraseptal ?emphysema. ?  ?Peribronchovascular branching fat density right middle lobe ?nodularity measuring up to 9 mm favored to represent  mucoid ?impaction/plugging. ?  ?Chronic appearing right lower lobe diffuse bronchial wall ?thickening/bronchitis. ?  ?Chronic basilar interstitial changes and trace pleural effusions. ?  ?Aortic Atherosclerosis (ICD10-I70.0) and Emphysema (ICD10-J43.9). ? ?EKG: Not done yet, we will get one ? ?Review of Systems:  ? ?General: no fevers, chills, no body weight gain, has fatigue ?HEENT: no blurry vision, hearing changes or sore throat ?Respiratory: has dyspnea, coughing ?CV: no chest pain, no palpitations ?GI: no nausea, vomiting, abdominal pain, diarrhea, constipation ?GU: no dysuria, burning on urination, increased urinary frequency, hematuria  ?Ext: has leg edema ?Neuro: no unilateral weakness, numbness, or tingling, no vision change or hearing loss ?Skin: no rash, no skin tear. ?MSK: No muscle spasm, no deformity, no limitation of range of movement in spin ?Heme: No easy bruising.  ?Travel history: No recent long distant travel. ? ? ?Allergy: No Known Allergies ? ?Past Medical History:  ?Diagnosis Date  ? CKD (chronic kidney disease)   ? Diabetes ([place])   ? HLD (hyperlipidemia)   ? Hypertension   ? ? ?Past Surgical History:  ?Procedure Laterality Date  ? APPENDECTOMY    ? CATARACT EXTRACTION    ? ? ?Social History:  reports that he has quit smoking. He has never used smokeless tobacco. He reports current alcohol use. He reports that he does not use drugs. ? ?Family History:  ?Family History  ?Problem Relation Age of Onset  ? Stroke Father   ? Lung cancer Brother   ? Stroke Brother   ?  ? ?Prior to Admission medications   ?Medication Sig Start Date End Date Taking? Authorizing Provider  ?apixaban (ELIQUIS) 2.5 MG TABS tablet Take 1 tablet (  2.5 mg total) by mouth 2 (two) times daily. 08/09/21   Sharen Hones, MD  ?aspirin 81 MG chewable tablet Chew 1 tablet (81 mg total) by mouth daily. 08/09/21   Sharen Hones, MD  ?atorvastatin (LIPITOR) 40 MG tablet Take 1 tablet (40 mg total) by mouth every evening. 08/09/21   Sharen Hones, MD  ?finasteride (PROSCAR) 5 MG tablet Take 5 mg by mouth daily. 03/04/18   [provider]  ?levothyroxine (SYNTHROID) 50 MCG tablet Take 50 mcg by mouth daily. 06/04/21   [provider]  ?metoprolol tartrate (LOPRESSOR) 25 MG tablet Take 2 tablets (50 mg total) by mouth 2 (two) times daily. 08/09/21   Sharen Hones, MD  ?tamsulosin (FLOMAX) 0.4 MG CAPS capsule Take 0.4 mg by mouth daily. 03/04/18   [provider]  ?zolpidem (AMBIEN) 10 MG tablet TAKE 1 TABLET BY MOUTH NIGHTLY AS NEEDED FOR SLEEP 08/17/18   [provider]  ? ? ?Physical Exam: ?Vitals:  ? 08/27/21 1058 08/27/21 1402 08/27/21 1430 08/27/21 1533  ?BP: 116/68 (!) 161/102 (!) 175/94 (!) 168/88  ?Pulse: 70 (!) 114 (!) 126 (!) 108  ?Resp: 18 18 14 16   ?Temp: 98 ?F (36.7 ?C)     ?TempSrc: Oral     ?SpO2: 98% 94% 96% 99%  ?Weight:      ?Height:      ? ?General: Not in acute distress ?HEENT: ?      Eyes: PERRL, EOMI, no scleral icterus. ?      ENT: No discharge from the ears and nose, no pharynx injection, no tonsillar enlargement.  ?      Neck: No JVD, no bruit, no mass felt. ?Heme: No neck lymph node enlargement. ?Cardiac: S1/S2, RRR, No murmurs, No gallops or rubs. ?Respiratory: has rhonchi and mild wheezing bilaterally ?GI: Soft, nondistended, nontender, no rebound pain, no organomegaly, BS present. ?GU: No hematuria ?Ext: Trace pitting leg edema bilaterally. 1+DP/PT pulse bilaterally. ?Musculoskeletal: No joint deformities, No joint redness or warmth, no limitation of ROM in spin. ?Skin: No rashes.  ?Neuro: Alert, oriented X3, cranial nerves II-XII grossly intact, moves all extremities normally.  ?Psych: Patient is not psychotic, no suicidal or hemocidal ideation. ? ?Labs on Admission: I have personally reviewed following labs and imaging studies ? ?CBC: ?Recent Labs  ?Lab 08/27/21 ?1117  ?WBC 7.9  ?NEUTROABS 6.4  ?HGB 10.6*  ?HCT 33.3*  ?MCV 93.3  ?PLT 244  ? ?Basic Metabolic Panel: ?Recent Labs  ?Lab  08/27/21 ?1117  ?NA 134*  ?K 4.5  ?CL 104  ?CO2 24  ?GLUCOSE 202*  ?BUN 16  ?CREATININE 1.37*  ?CALCIUM 8.3*  ? ?GFR: ?Estimated Creatinine Clearance: 28.7 mL/min (A) (by C-G formula based on SCr of 1.37 mg/dL (H)). ?Liver Function Tests: ?Recent Labs  ?Lab 08/27/21 ?1117  ?AST 28  ?ALT 20  ?ALKPHOS 81  ?BILITOT 0.5  ?PROT 6.5  ?ALBUMIN 2.7*  ? ?No results for input(s): LIPASE, AMYLASE in the last 168 hours. ?No results for input(s): AMMONIA in the last 168 hours. ?Coagulation Profile: ?Recent Labs  ?Lab 08/27/21 ?1117  ?INR 1.1  ? ?Cardiac Enzymes: ?No results for input(s): CKTOTAL, CKMB, CKMBINDEX, TROPONINI in the last 168 hours. ?BNP (last 3 results) ?No results for input(s): PROBNP in the last 8760 hours. ?HbA1C: ?No results for input(s): HGBA1C in the last 72 hours. ?CBG: ?Recent Labs  ?Lab 08/27/21 ?1651  ?GLUCAP 130*  ? ?Lipid Profile: ?No results for input(s): CHOL, HDL, LDLCALC, TRIG, CHOLHDL, LDLDIRECT in the  last 72 hours. ?Thyroid Function Tests: ?No results for input(s): TSH, T4TOTAL, FREET4, T3FREE, THYROIDAB in the last 72 hours. ?Anemia Panel: ?No results for input(s): VITAMINB12, FOLATE, FERRITIN, TIBC, IRON, RETICCTPCT in the last 72 hours. ?Urine analysis: ?   ?Component Value Date/Time  ? COLORURINE YELLOW (A) 08/27/2021 1355  ? APPEARANCEUR HAZY (A) 08/27/2021 1355  ? APPEARANCEUR Clear 04/17/2012 1954  ? LABSPEC 1.004 (L) 08/27/2021 1355  ? LABSPEC 1.005 04/17/2012 1954  ? PHURINE 6.0 08/27/2021 1355  ? GLUCOSEU NEGATIVE 08/27/2021 1355  ? GLUCOSEU Negative 04/17/2012 1954  ? [place] NEGATIVE 08/27/2021 1355  ? [place] NEGATIVE 08/27/2021 1355  ? BILIRUBINUR Negative 04/17/2012 1954  ? [place] NEGATIVE 08/27/2021 1355  ? PROTEINUR NEGATIVE 08/27/2021 1355  ? NITRITE NEGATIVE 08/27/2021 1355  ? LEUKOCYTESUR SMALL (A) 08/27/2021 1355  ? LEUKOCYTESUR 1+ 04/17/2012 1954  ? ?Sepsis Labs: ?@LABRCNTIP (procalcitonin:4,lacticidven:4) ?) ?Recent Results (from the past 240 hour(s))  ?Resp Panel by  RT-PCR (Flu A&B, Covid) Expectorated Sputum     Status: None  ? Collection Time: 08/27/21  3:38 PM  ? Specimen: Expectorated Sputum; Nasopharyngeal(NP) swabs in vial transport medium  ?Result Value Ref Range Status  ? SARS Coronavi

## 2021-08-27 NOTE — Progress Notes (Signed)
Patient arrived to room 250 from ED.  Assessment complete, VS obtained, and Admission database began.  ?

## 2021-08-27 NOTE — ED Triage Notes (Signed)
Presents with chest and nasal congestion for the past 4-5 days   denies any fever ?

## 2021-08-28 DIAGNOSIS — R339 Retention of urine, unspecified: Secondary | ICD-10-CM

## 2021-08-28 DIAGNOSIS — J209 Acute bronchitis, unspecified: Secondary | ICD-10-CM | POA: Diagnosis not present

## 2021-08-28 LAB — BASIC METABOLIC PANEL
Anion gap: 10 (ref 5–15)
BUN: 19 mg/dL (ref 8–23)
CO2: 20 mmol/L — ABNORMAL LOW (ref 22–32)
Calcium: 8.3 mg/dL — ABNORMAL LOW (ref 8.9–10.3)
Chloride: 102 mmol/L (ref 98–111)
Creatinine, Ser: 1.46 mg/dL — ABNORMAL HIGH (ref 0.61–1.24)
GFR, Estimated: 43 mL/min — ABNORMAL LOW (ref >60)
Glucose, Bld: 312 mg/dL — ABNORMAL HIGH (ref 70–99)
Potassium: 4.2 mmol/L (ref 3.5–5.1)
Sodium: 132 mmol/L — ABNORMAL LOW (ref 135–145)

## 2021-08-28 LAB — CBC
HCT: 31.8 % — ABNORMAL LOW (ref 39.0–52.0)
Hemoglobin: 10.5 g/dL — ABNORMAL LOW (ref 13.0–17.0)
MCH: 29.4 pg (ref 26.0–34.0)
MCHC: 33 g/dL (ref 30.0–36.0)
MCV: 89.1 fL (ref 80.0–100.0)
Platelets: 261 10*3/uL (ref 150–400)
RBC: 3.57 MIL/uL — ABNORMAL LOW (ref 4.22–5.81)
RDW: 14.7 % (ref 11.5–15.5)
WBC: 11.1 10*3/uL — ABNORMAL HIGH (ref 4.0–10.5)
nRBC: 0 % (ref 0.0–0.2)

## 2021-08-28 LAB — GLUCOSE, CAPILLARY
Glucose-Capillary: 296 mg/dL — ABNORMAL HIGH (ref 70–99)
Glucose-Capillary: 136 mg/dL — ABNORMAL HIGH (ref 70–99)
Glucose-Capillary: 163 mg/dL — ABNORMAL HIGH (ref 70–99)

## 2021-08-28 MED ORDER — SENNA 8.6 MG PO TABS
1.0000 | ORAL_TABLET | Freq: Two times a day (BID) | ORAL | Status: DC
  Administered 2021-08-28 – 2021-08-30 (×4): 8.6 mg via ORAL
  Filled 2021-08-28 (×5): qty 1

## 2021-08-28 MED ORDER — IPRATROPIUM-ALBUTEROL 0.5-2.5 (3) MG/3ML IN SOLN
3.0000 mL | Freq: Four times a day (QID) | RESPIRATORY_TRACT | Status: DC
  Administered 2021-08-28: 3 mL via RESPIRATORY_TRACT
  Filled 2021-08-28: qty 3

## 2021-08-28 NOTE — Progress Notes (Signed)
?PROGRESS NOTE ? ? ? ?Joshua Rogers  [id number] DOB: [birth year] DOA: 08/27/2021 ?PCP: Idelle Crouch, MD  ? ? ?Brief Narrative:  ?This 88 years old male with PMH significant for hypertension, hyperlipidemia, type 2 diabetes, GERD, CKD stage IIIa, BPH, Diastolic CHF, CAD, NSTEMI, history of A-fib on Eliquis presented in the ED with complaints of cough and shortness of breath. ?Patient was recently hospitalized from 08/08/2021 to 08/09/2021 due to NSTEMI, patient was also found to have atrial fibrillation and started on Eliquis.  Patient reports has developed cough and progressive shortness of breath which is worsening.  He coughs up yellow sputum mixed with pink colored streaks yesterday. ?CTA negative for PE, peribronchial branching favored mucoid impaction/plugging.  Chronic appearing right lower lobe diffuse bronchial wall thickening bronchitis.  Patient was admitted for acute bronchitis started on empiric antibiotics. ? ?Assessment & Plan: ?  ?Principal Problem: ?  Acute bronchitis ?Active Problems: ?  HTN (hypertension) ?  HLD (hyperlipidemia) ?  Chronic kidney disease, stage 3a ([place]) ?  Chronic atrial fibrillation with RVR ([facility]) ?  Hypothyroidism ?  BPH (benign prostatic hyperplasia) ?  UTI (urinary tract infection) ?  Elevated troponin ?  Chronic diastolic CHF (congestive heart failure) ([place]) ?  CAD (coronary artery disease) ? ?Suspected acute bronchitis: ?Patient presented with shortness of breath and productive cough mixed with pink-colored blood. ?CTA negative for PE,  possibly due to bronchitis. ?Patient does not meet sepsis criteria, sepsis ruled out. ?Continue empiric antibiotic IV Rocephin and Zithromax. ?Patient has received Solu-Medrol IV 125 mg once. ?Continue bronchodilators,  continue Mucinex for cough. ?Obtain urine Legionella and strep pneumonia antigen. ?Follow up Blood cultures.  Obtain procalcitonin level. ? ?Essential hypertension: ?Continue metoprolol, ?Continue IV hydralazine as  needed ? ?Hyperlipidemia ?Continue Lipitor ? ?CKD stage IIIa: ?Serum creatinine at baseline. ?Continue to monitor serum creatinine. ? ?Atrial fibrillation with RVR: ?Continue metoprolol, continue IV Cardizem. ?Hold anticoagulation due to hemoptysis. ? ?Hypothyroidism: ?Continue Synthroid ? ?BPH: ?Continue Flomax/Proscar ?  ?Possible UTI: ?Continue Rocephin.  Follow-up urine cultures ? ?Elevated troponins :  ?History of CAD ?Patient denies any chest pain, likely demand ischemia.  ?Less likely ACS , troponin level 47, 57.   ?\Hold aspirin due to possible hemoptysis. ?Continue Lipitor. ?  ?Chronic diastolic CHF: ?2D echo on 08/09/2021 showed EF 60 to 65% with grade 3 diastolic dysfunction.   ?BNP is slightly elevated at 419, No edema,  does not seem to have CHF exacerbation. ?Restart low-dose oral Lasix 20 mg daily. ? ? ?DVT prophylaxis: SCDs ?Code Status: DNR ?Family Communication: No family at bed side. ?Disposition Plan: ? ?Admitted for suspected acute bronchitis requiring IV antibiotics. ? ? ?Consultants:  ?None ? ?Procedures: CTA chest ?Antimicrobials:  ?Anti-infectives (From admission, onward)  ? ? Start     Dose/Rate Route Frequency Ordered Stop  ? 08/28/21 1700  cefTRIAXone (ROCEPHIN) 1 g in sodium chloride 0.9 % 100 mL IVPB       ? 1 g ?200 mL/hr over 30 Minutes Intravenous Every 24 hours 08/27/21 1816    ? 08/28/21 1000  azithromycin (ZITHROMAX) tablet 500 mg       ? 500 mg Oral Daily 08/27/21 1816    ? 08/27/21 1545  cefTRIAXone (ROCEPHIN) 1 g in sodium chloride 0.9 % 100 mL IVPB       ? 1 g ?200 mL/hr over 30 Minutes Intravenous  Once 08/27/21 1537 08/27/21 1731  ? 08/27/21 1545  azithromycin (ZITHROMAX) tablet 500 mg       ?  500 mg Oral  Once 08/27/21 1537 08/27/21 1652  ? ?  ?  ? ?Subjective: ?Patient was seen and examined at bedside.  Overnight events noted.   ?Patient reports feeling much improved.  He denies any chest pain or shortness of breath.  He reports cough is improving. ? ?Objective: ?Vitals:  ?  08/28/21 0600 08/28/21 0710 08/28/21 0754 08/28/21 1100  ?BP: 113/66  (!) 108/58 (!) 94/51  ?Pulse: 81  (!) 108 88  ?Resp: (!) 21  (!) 21 (!) 22  ?Temp:   98 ?F (36.7 ?C) 98.2 ?F (36.8 ?C)  ?TempSrc:   Oral Oral  ?SpO2: 96% 95% 96% 97%  ?Weight:      ?Height:      ? ? ?Intake/Output Summary (Last 24 hours) at 08/28/2021 1355 ?Last data filed at 08/28/2021 1000 ?Gross per 24 hour  ?Intake 447.59 ml  ?Output 862 ml  ?Net -414.41 ml  ? ?Filed Weights  ? 08/27/21 1047  ?Weight: 65.8 kg  ? ? ?Examination: ? ?General exam: Appears comfortable, not in any acute distress.  Deconditioned ?Respiratory system: CTA bilaterally, respiratory effort normal, no wheezing, no crackles. ?Cardiovascular system: S1 & S2 heard, regular rate and rhythm, no murmur. ?Gastrointestinal system: Abdomen is soft, non tender, non distended, BS+ ?Central nervous system: Alert and oriented x 3. No focal neurological deficits. ?Extremities: No edema, no cyanosis, no clubbing. ?Skin: No rashes, lesions or ulcers ?Psychiatry: Judgement and insight appear normal. Mood & affect appropriate.  ? ? ? ?Data Reviewed: I have personally reviewed following labs and imaging studies ? ?CBC: ?Recent Labs  ?Lab 08/27/21 ?1117 08/28/21 ?[id number]  ?WBC 7.9 11.1*  ?NEUTROABS 6.4  --   ?HGB 10.6* 10.5*  ?HCT 33.3* 31.8*  ?MCV 93.3 89.1  ?PLT 244 261  ? ?Basic Metabolic Panel: ?Recent Labs  ?Lab 08/27/21 ?1117 08/28/21 ?[id number]  ?NA 134* 132*  ?K 4.5 4.2  ?CL 104 102  ?CO2 24 20*  ?GLUCOSE 202* 312*  ?BUN 16 19  ?CREATININE 1.37* 1.46*  ?CALCIUM 8.3* 8.3*  ? ?GFR: ?Estimated Creatinine Clearance: 26.9 mL/min (A) (by C-G formula based on SCr of 1.46 mg/dL (H)). ?Liver Function Tests: ?Recent Labs  ?Lab 08/27/21 ?1117  ?AST 28  ?ALT 20  ?ALKPHOS 81  ?BILITOT 0.5  ?PROT 6.5  ?ALBUMIN 2.7*  ? ?No results for input(s): LIPASE, AMYLASE in the last 168 hours. ?No results for input(s): AMMONIA in the last 168 hours. ?Coagulation Profile: ?Recent Labs  ?Lab 08/27/21 ?1117  ?INR 1.1   ? ?Cardiac Enzymes: ?No results for input(s): CKTOTAL, CKMB, CKMBINDEX, TROPONINI in the last 168 hours. ?BNP (last 3 results) ?No results for input(s): PROBNP in the last 8760 hours. ?HbA1C: ?No results for input(s): HGBA1C in the last 72 hours. ?CBG: ?Recent Labs  ?Lab 08/27/21 ?1651 08/27/21 ?2150 08/28/21 ?1216  ?GLUCAP 130* 312* 296*  ? ?Lipid Profile: ?No results for input(s): CHOL, HDL, LDLCALC, TRIG, CHOLHDL, LDLDIRECT in the last 72 hours. ?Thyroid Function Tests: ?No results for input(s): TSH, T4TOTAL, FREET4, T3FREE, THYROIDAB in the last 72 hours. ?Anemia Panel: ?No results for input(s): VITAMINB12, FOLATE, FERRITIN, TIBC, IRON, RETICCTPCT in the last 72 hours. ?Sepsis Labs: ?Recent Labs  ?Lab 08/27/21 ?[id number]  ?PROCALCITON <0.10  ? ? ?Recent Results (from the past 240 hour(s))  ?Resp Panel by RT-PCR (Flu A&B, Covid) Expectorated Sputum     Status: None  ? Collection Time: 08/27/21  3:38 PM  ? Specimen: Expectorated Sputum; Nasopharyngeal(NP) swabs in vial transport medium  ?Result  Value Ref Range Status  ? SARS Coronavirus 2 by RT PCR NEGATIVE NEGATIVE Final  ?  Comment: (NOTE) ?SARS-CoV-2 target nucleic acids are NOT DETECTED. ? ?The SARS-CoV-2 RNA is generally detectable in upper respiratory ?specimens during the acute phase of infection. The lowest ?concentration of SARS-CoV-2 viral copies this assay can detect is ?138 copies/mL. A negative result does not preclude SARS-Cov-2 ?infection and should not be used as the sole basis for treatment or ?other patient management decisions. A negative result may occur with  ?improper specimen collection/handling, submission of specimen other ?than nasopharyngeal swab, presence of viral mutation(s) within the ?areas targeted by this assay, and inadequate number of viral ?copies(<138 copies/mL). A negative result must be combined with ?clinical observations, patient history, and epidemiological ?information. The expected result is Negative. ? ?Fact Sheet for  Patients:  ?EntrepreneurPulse.com.au ? ?Fact Sheet for Healthcare Providers:  ?IncredibleEmployment.be ? ?This test is no t yet approved or cleared by the [place] and

## 2021-08-28 NOTE — Evaluation (Signed)
Physical Therapy Evaluation ?Patient Details ?Name: Joshua Rogers ?MRN: [id number] ?DOB: 09-07-1922 ?Today's Date: 08/28/2021 ? ?History of Present Illness ? Per MD H&P: Joshua Rogers is a 88 y.o. male with medical history significant of  hypertension, hyperlipidemia, diet-controlled diabetes mellitus, GERD, CKD stage IIIa, BPH, dCHF, CAD, NSTEMI, A fib on Eliquis, who present with cough and SOB. Patient was recently hospitalized from 3/23 - 3/24 due to non-STEMI.  Patient also was found to have atrial fibrillation and started on Eliquis. Patient states that in the past several days, he has been having cough and shortness of breath which has been progressively worsening ?  ?Clinical Impression ? Pt admitted with above diagnosis. Pt received upright in bed, agreeable to PT services and eval. Pt reports at baseline he is mod-I with [facility] for household ambulation and ADL/IADL tasks. Pt states he and his spouse rely on daughter for groceries as pt does not drive or go out into the community often. Pt in A-fib with HR ranging from upper 90's to 130's BPM at rest thus limiting eval for pt safety. Pt able to transfer from supine to EOB mod-I and stand without AD with supervision with posterior aspects of knees on bedside to assist in balance. Attempts made to urinate in urinal in standing with pt maintaining static standing balance without LOB or unsteadiness. HR did reach up to 144 BPM with standing quickly returning to 120's once standing. Due to a-fib, further mobility deferred with pt returning to supine independently with all needs in reach and HR returning to upper 90's to mid 120's. Education provided on gentle LE therex and mobility to assist in maintaining strength as best as pt can to pt tolerance to prevent further deconditioning due to pt's HR. Pt verbalizing understanding. Anticipate pt will be safe to d/c home pending safe progression to ambulation. Recommend more frequent supervision from family at d/c as  pt is currently hospitalized as well for PNA. Education provided to pt on benefits of [place] [facility] however pt likely to decline stating it was not helpful for him in the past. Pt currently with functional limitations due to the deficits listed below (see PT Problem List). Pt will benefit from skilled PT to increase their independence and safety with mobility to allow discharge to the venue listed below.    ? ?BP prior to mobility: 94/56 mm Hg, pt denying dizziness or lightheadedness with transfers and standing.  ? ?Recommendations for follow up therapy are one component of a multi-disciplinary discharge planning process, led by the attending physician.  Recommendations may be updated based on patient status, additional functional criteria and insurance authorization. ? ?Follow Up Recommendations Home health PT ? ?  ?Assistance Recommended at Discharge Frequent or constant Supervision/Assistance  ?Patient can return home with the following ? Assistance with cooking/housework;Assist for transportation;Help with stairs or ramp for entrance ? ?  ?Equipment Recommendations None recommended by PT  ?Recommendations for Other Services ?    ?  ?Functional Status Assessment Patient has had a recent decline in their functional status and demonstrates the ability to make significant improvements in function in a reasonable and predictable amount of time.  ? ?  ?Precautions / Restrictions Precautions ?Precautions: Fall ?Restrictions ?Weight Bearing Restrictions: No  ? ?  ? ?Mobility ? Bed Mobility ?Overal bed mobility: Needs Assistance ?Bed Mobility: Supine to Sit, Sit to Supine ?  ?  ?Supine to sit: Modified independent (Device/Increase time), HOB elevated ?Sit to supine: Modified independent (Device/Increase time), HOB elevated ?  ?  General bed mobility comments: increased time ?Patient Response: Cooperative ? ?Transfers ?Overall transfer level: Needs assistance ?Equipment used: None ?Transfers: Sit to/from Stand ?Sit to Stand:  Supervision ?  ?  ?  ?  ?  ?General transfer comment: stands without AD at bedside ?  ? ?Ambulation/Gait ?  ?  ?  ?  ?  ?  ?  ?General Gait Details: deferred due to HR ? ?Stairs ?  ?  ?  ?  ?  ? ?Wheelchair Mobility ?  ? ?Modified Rankin (Stroke Patients Only) ?  ? ?  ? ?Balance Overall balance assessment: Needs assistance ?Sitting-balance support: No upper extremity supported, Feet supported ?Sitting balance-Leahy Scale: Good ?Sitting balance - Comments: able to don shoes within BOS ?Postural control: Posterior lean ?Standing balance support: No upper extremity supported ?Standing balance-Leahy Scale: Fair ?Standing balance comment: Slight posterior lean in standing using posterior aspects of knees to assist in standing. Pt reports this is baseline ?  ?  ?  ?  ?  ?  ?  ?  ?  ?  ?  ?   ? ? ? ?Pertinent Vitals/Pain Pain Assessment ?Pain Assessment: No/denies pain  ? ? ?Home Living Family/patient expects to be discharged to:: Private residence ?Living Arrangements: Spouse/significant other ?Available Help at Discharge: Family;Available PRN/intermittently ?Type of Home: House ?Home Access: Stairs to enter;Level entry ?Entrance Stairs-Rails: None ?Entrance Stairs-Number of Steps: 3-4 to enter the front. Level entry at backdoor which is pt's primary entrance. 2-3 stairs inside home for living space ?  ?Home Layout: One level ?Home Equipment: Conservation officer, nature (2 wheels);Cane - single point;Shower seat ?   ?  ?Prior Function Prior Level of Function : Independent/Modified Independent ?  ?  ?  ?  ?  ?  ?  ?  ?  ? ? ?Hand Dominance  ? Dominant Hand: Right ? ?  ?Extremity/Trunk Assessment  ? Upper Extremity Assessment ?Upper Extremity Assessment: Defer to OT evaluation ?  ? ?Lower Extremity Assessment ?Lower Extremity Assessment: Generalized weakness (LE strength grossly 3/5) ?  ? ?Cervical / Trunk Assessment ?Cervical / Trunk Assessment: Normal  ?Communication  ? Communication: No difficulties  ?Cognition Arousal/Alertness:  Awake/alert ?Behavior During Therapy: [facility] for tasks assessed/performed ?Overall Cognitive Status: Within Functional Limits for tasks assessed ?  ?  ?  ?  ?  ?  ?  ?  ?  ?  ?  ?  ?  ?  ?  ?  ?  ?  ?  ? ?  ?General Comments General comments (skin integrity, edema, etc.): HR ranging from 97 BPM up to 144 BPM from rest to standing EOB. ? ?  ?Exercises General Exercises - Lower Extremity ?Ankle Circles/Pumps: AROM, Strengthening, Both, 10 reps, Supine ?Long Arc Quad: AROM, Strengthening, Both, 10 reps, Seated ?Hip ABduction/ADduction: AROM, Strengthening, Both, 10 reps, Supine ?Straight Leg Raises: AROM, Strengthening, Both, 5 reps, Supine ?Hip Flexion/Marching: AROM, Strengthening, Both, 10 reps, Seated ?Other Exercises ?Other Exercises: Role of PT in acute setting, D/c recs, LE therex.  ? ?Assessment/Plan  ?  ?PT Assessment Patient needs continued PT services  ?PT Problem List Decreased strength;Decreased activity tolerance;Cardiopulmonary status limiting activity;Decreased balance ? ?   ?  ?PT Treatment Interventions DME instruction;Therapeutic exercise;Gait training;Balance training;Stair training;Neuromuscular re-education;Functional mobility training;Therapeutic activities;Patient/family education   ? ?PT Goals (Current goals can be found in the Care Plan section)  ?Acute Rehab PT Goals ?Patient Stated Goal: to go home ?PT Goal Formulation: With patient ?Time For Goal Achievement: 09/11/21 ?  Potential to Achieve Goals: Good ? ?  ?Frequency Min 2X/week ?  ? ? ?Co-evaluation   ?  ?  ?  ?  ? ? ?  ?AM-PAC PT "6 Clicks" Mobility  ?Outcome Measure Help needed turning from your back to your side while in a flat bed without using bedrails?: A Little ?Help needed moving from lying on your back to sitting on the side of a flat bed without using bedrails?: A Little ?Help needed moving to and from a bed to a chair (including a wheelchair)?: A Little ?Help needed standing up from a chair using your arms (e.g., wheelchair or  bedside chair)?: A Little ?Help needed to walk in hospital room?: A Little ?Help needed climbing 3-5 steps with a railing? : A Lot ?6 Click Score: 17 ? ?  ?End of Session   ?Activity Tolerance: Patient tolerated t

## 2021-08-28 NOTE — Evaluation (Signed)
Occupational Therapy Evaluation ?Patient Details ?Name: Joshua Rogers ?MRN: [id number] ?DOB: January 06, 1923 ?Today's Date: 08/28/2021 ? ? ?History of Present Illness Joshua Rogers is a 88 y.o. male with medical history significant of  hypertension, hyperlipidemia, diet-controlled diabetes mellitus, GERD, CKD stage IIIa, BPH, dCHF, CAD, NSTEMI, A fib on Eliquis, who present with cough and SOB. Patient was recently hospitalized from 3/23 - 3/24 due to non-STEMI.  Patient also was found to have atrial fibrillation and started on Eliquis. Patient states that in the past several days, he has been having cough and shortness of breath which has been progressively worsening. Pt admitted for possible acute bronchitis  ? ?Clinical Impression ?  ?Pt seen for OT evaluation this date. Prior to admission, pt was independent in all ADLs and using a SPC for functional mobility, living in a 1-story home with wife. Pt denies any falls within the past 12 months. Pt currently requires SUPERVISION for seated LB dressing, SUPERVISION for functional mobility of short household distances (59ft) with SPC, MIN GUARD for sit>stand peri-care, and SUPERVISION for standing grooming tasks d/t decreased strength and activity tolerance. Pt would benefit from additional skilled OT services to maximize return to PLOF and minimize risk of future falls, injury, caregiver burden, and readmission. Upon discharge, recommend [place] services.     ? ?Note: HR ranging 90-110s during activity. Following walk of 35ft and stand>sit transfer, pt's HR increasing to 141bpm briefly, however quickly returning to 100-110s with activity cessation. Pt denying dizziness or pain throughout session. ?   ? ?Recommendations for follow up therapy are one component of a multi-disciplinary discharge planning process, led by the attending physician.  Recommendations may be updated based on patient status, additional functional criteria and insurance authorization.  ? ?Follow Up  Recommendations ? Home health OT  ?  ?Assistance Recommended at Discharge Intermittent Supervision/Assistance  ?Patient can return home with the following A little help with walking and/or transfers;A little help with bathing/dressing/bathroom;Assistance with cooking/housework ? ?  ?Functional Status Assessment ? Patient has had a recent decline in their functional status and demonstrates the ability to make significant improvements in function in a reasonable and predictable amount of time.  ?Equipment Recommendations ? None recommended by OT  ?  ?   ?Precautions / Restrictions Precautions ?Precautions: Fall ?Restrictions ?Weight Bearing Restrictions: No  ? ?  ? ?Mobility Bed Mobility ?Overal bed mobility: Modified Independent ?Bed Mobility: Supine to Sit ?  ?  ?Supine to sit: Modified independent (Device/Increase time), HOB elevated ?  ?  ?  ?  ? ?Transfers ?Overall transfer level: Needs assistance ?Equipment used: Straight cane ?Transfers: Sit to/from Stand ?Sit to Stand: Supervision ?  ?  ?  ?  ?  ?  ?  ? ?  ?Balance Overall balance assessment: Needs assistance ?Sitting-balance support: No upper extremity supported, Feet supported ?Sitting balance-Leahy Scale: Good ?Sitting balance - Comments: Good sitting balance at EOB during LB dressing ?  ?Standing balance support: No upper extremity supported, During functional activity ?Standing balance-Leahy Scale: Fair ?Standing balance comment: Requires SUPERVISION for standing hand hygiene ?  ?  ?  ?  ?  ?  ?  ?  ?  ?  ?  ?   ? ?ADL either performed or assessed with clinical judgement  ? ?ADL Overall ADL's : Needs assistance/impaired ?  ?  ?Grooming: Wash/dry hands;Supervision/safety;Standing ?  ?  ?  ?  ?  ?  ?  ?Lower Body Dressing: Supervision/safety;Set up;Sitting/lateral leans ?Lower Body Dressing  Details (indicate cue type and reason): to don shoes ?  ?  ?Toileting- Water quality scientist and Hygiene: Min guard;Sit to/from stand ?Toileting - Clothing Manipulation  Details (indicate cue type and reason): MIN GUARD for performing sit>stand peri-care ?  ?  ?Functional mobility during ADLs: Supervision/safety;Cane (to walk 31ft) ?   ? ? ? ?Vision Ability to See in Adequate Light: 0 Adequate ?Patient Visual Report: No change from baseline ?   ?   ?   ?   ? ?Pertinent Vitals/Pain Pain Assessment ?Pain Assessment: No/denies pain  ? ? ? ?Hand Dominance Right ?  ?Extremity/Trunk Assessment Upper Extremity Assessment ?Upper Extremity Assessment: Overall WFL for tasks assessed ?  ?Lower Extremity Assessment ?Lower Extremity Assessment: Generalized weakness ?  ?Cervical / Trunk Assessment ?Cervical / Trunk Assessment: Normal ?  ?Communication Communication ?Communication: No difficulties ?  ?Cognition Arousal/Alertness: Awake/alert ?Behavior During Therapy: [facility] for tasks assessed/performed ?Overall Cognitive Status: Within Functional Limits for tasks assessed ?  ?  ?  ?  ?  ?  ?  ?  ?  ?  ?  ?  ?  ?  ?  ?  ?  ?  ?  ?General Comments  HR ranging 90-110s during activity. Following walk of 70ft and stand>sit transfer, pt's HR increasing to 141bpm briefly and quickly returning to 100-110s with activity cessation. ? ?  ?   ?   ? ? ?Home Living Family/patient expects to be discharged to:: Private residence ?Living Arrangements: Spouse/significant other (Pt's wife is currently admitted in hospital) ?Available Help at Discharge: Family;Available PRN/intermittently (Children lives close by) ?Type of Home: House ?Home Access: Stairs to enter;Level entry ?Entrance Stairs-Number of Steps: 3-4 to enter the front. Level entry at backdoor which is pt's primary entrance. 2-3 stairs inside home for living space ?Entrance Stairs-Rails: None ?Home Layout: One level ?  ?  ?Bathroom Shower/Tub: Tub/shower unit ?  ?Bathroom Toilet: Standard ?Bathroom Accessibility: Yes ?  ?Home Equipment: Conservation officer, nature (2 wheels);Cane - single point;Shower seat ?  ?  ?  ? ?  ?Prior Functioning/Environment Prior Level of  Function : Independent/Modified Independent ?  ?  ?  ?  ?  ?  ?Mobility Comments: Pt reports he uses SPC for functional mobility. Denies any falls within the past 12 months ?ADLs Comments: Pt reports he is indepedent with all ADLs and cooks. Pt's daughter assists with cleaning, grocery shopping, and transportation ?  ? ?  ?  ?OT Problem List: Decreased strength;Decreased activity tolerance;Impaired balance (sitting and/or standing);Cardiopulmonary status limiting activity ?  ?   ?OT Treatment/Interventions: Self-care/ADL training;Therapeutic exercise;Energy conservation;DME and/or AE instruction;Therapeutic activities;Patient/family education;Balance training  ?  ?OT Goals(Current goals can be found in the care plan section) Acute Rehab OT Goals ?Patient Stated Goal: to return home ?OT Goal Formulation: With patient ?Time For Goal Achievement: 09/11/21 ?Potential to Achieve Goals: Good ?ADL Goals ?Pt Will Perform Grooming: with modified independence;standing ?Pt Will Perform Lower Body Bathing: with modified independence;sit to/from stand ?Pt Will Transfer to Toilet: with modified independence;ambulating;regular height toilet  ?OT Frequency: Min 2X/week ?  ? ?   ?AM-PAC OT "6 Clicks" Daily Activity     ?Outcome Measure Help from another person eating meals?: None ?Help from another person taking care of personal grooming?: A Little ?Help from another person toileting, which includes using toliet, bedpan, or urinal?: A Little ?Help from another person bathing (including washing, rinsing, drying)?: A Little ?Help from another person to put on and taking off regular upper body  clothing?: None ?Help from another person to put on and taking off regular lower body clothing?: A Little ?6 Click Score: 20 ?  ?End of Session Equipment Utilized During Treatment: Other (comment) [facility]) ?Nurse Communication: Mobility status ? ?Activity Tolerance: Patient tolerated treatment well ?Patient left: in chair;with call bell/phone within  reach;with chair alarm set ? ?OT Visit Diagnosis: Unsteadiness on feet (R26.81)  ?              ?Time: [id number] ?OT Time Calculation (min): 29 min ?Charges:  OT General Charges ?$OT Visit: 1 Visit ?OT [place]

## 2021-08-28 NOTE — Progress Notes (Signed)
Pt self-catheterizes at home at 0800, 1500, and 2100.  Pt was straight catheterized this morning at 0500 by nursing staff for urinary retention.  Pt confirmed to RN that he would need recatheterization at approximately 1200.  Pt attempted to catheterize self with coude and was unable to pass his prostate. RN attempted and was also unable to pass pt's prostate.  RN reported occurrence to MD.  Dr. Lucianne Muss agreed that indwelling catheter was called for given recent difficulty draining the pt's bladder.  RN attempted to place 72fr foley in presence of 1 other RN but again failed to pass the prostate.  Pt did report acute pain.  MD was notified of current difficulty and ordered urology consult to assist.  Pt had BM on BSC and also urinated while passing a small to medium sized soft stool. ?

## 2021-08-28 NOTE — Consult Note (Signed)
?    ? ?Urology Consult ? ?I have been asked to see the patient by Dr. Lucianne Muss, for evaluation and management of Foley catheter problems. ? ?Chief Complaint: Unable to cath ? ?History of Present Illness: Joshua Rogers is a 88 y.o. year old male with a personal history of BPH and chronic urinary retention managed by self cath admitted for suspected acute bronchitis.  Urology was consulted for difficulty with Foley catheter. ? ?Joshua Rogers reports that he was formally a patient of Dr. Luz Brazen who is since retired.  He has not been seen in over 10 or more years.  Joshua Rogers may have had some sort of prostate procedure of unknown etiology and ultimately developed chronic urinary retention managed with self cath which has been doing for 10 or more years.  Initially, he was told to self cath 4 times a day but is since cut back to 3 times a day.  He does not void spontaneously without catheterizing.  He reports that he uses fairly small catheters at home and during this admission, the catheters were much bigger and different.   ? ?He had difficulty catheterizing himself earlier today and failed multiple attempts by nursing to I&O cath him.  Urology was called for assistance. ? ?Past Medical History:  ?Diagnosis Date  ? CKD (chronic kidney disease)   ? Diabetes (HCC)   ? HLD (hyperlipidemia)   ? Hypertension   ? ? ?Past Surgical History:  ?Procedure Laterality Date  ? APPENDECTOMY    ? CATARACT EXTRACTION    ? ? ?Home Medications:  ?Current Meds  ?Medication Sig  ? apixaban (ELIQUIS) 2.5 MG TABS tablet Take 1 tablet (2.5 mg total) by mouth 2 (two) times daily.  ? aspirin 81 MG chewable tablet Chew 1 tablet (81 mg total) by mouth daily.  ? atorvastatin (LIPITOR) 40 MG tablet Take 1 tablet (40 mg total) by mouth every evening.  ? finasteride (PROSCAR) 5 MG tablet Take 5 mg by mouth daily.  ? levothyroxine (SYNTHROID) 50 MCG tablet Take 50 mcg by mouth daily.  ? metoprolol tartrate (LOPRESSOR) 25 MG tablet Take 2 tablets (50  mg total) by mouth 2 (two) times daily.  ? tamsulosin (FLOMAX) 0.4 MG CAPS capsule Take 0.4 mg by mouth daily.  ? zolpidem (AMBIEN) 10 MG tablet Take 10 mg by mouth at bedtime as needed for sleep.  ? ? ?Allergies: No Known Allergies ? ?Family History  ?Problem Relation Age of Onset  ? Stroke Father   ? Lung cancer Brother   ? Stroke Brother   ? ? ?Social History:  reports that he has quit smoking. He has never used smokeless tobacco. He reports current alcohol use. He reports that he does not use drugs. ? ?ROS: ?A complete review of systems was performed.  All systems are negative except for pertinent findings as noted. ? ?Physical Exam:  ?Vital signs in last 24 hours: ?Temp:  [97.8 ?F (36.6 ?C)-98.9 ?F (37.2 ?C)] 98.2 ?F (36.8 ?C) (04/12 1613) ?Pulse Rate:  [81-111] 82 (04/12 1613) ?Resp:  [16-24] 21 (04/12 1613) ?BP: (88-140)/(51-99) 88/51 (04/12 1613) ?SpO2:  [92 %-97 %] 96 % (04/12 1613) ?Constitutional:  Alert and oriented, No acute distress ?HEENT: [place] AT, moist mucus membranes.  Trachea midline, no masses ?GU: Circumcised phallus with orthotopic meatus.  No blood at meatus.  Bilateral descended testicles. ?Neurologic: Grossly intact, no focal deficits, moving all 4 extremities ?Psychiatric: Normal mood and affect ? ? ?Laboratory Data:  ?Recent Labs  ?  08/27/21 ?1117 08/28/21 ?3329  ?WBC 7.9 11.1*  ?HGB 10.6* 10.5*  ?HCT 33.3* 31.8*  ? ?Recent Labs  ?  08/27/21 ?1117 08/28/21 ?5188  ?NA 134* 132*  ?K 4.5 4.2  ?CL 104 102  ?CO2 24 20*  ?GLUCOSE 202* 312*  ?BUN 16 19  ?CREATININE 1.37* 1.46*  ?CALCIUM 8.3* 8.3*  ? ?Recent Labs  ?  08/27/21 ?1117  ?INR 1.1  ? ?Radiologic Imaging: ?DG Chest 2 View ? ?Result Date: 08/27/2021 ?CLINICAL DATA:  Shortness of breath EXAM: CHEST - 2 VIEW COMPARISON:  08/08/2021 FINDINGS: Cardiomediastinal silhouette and pulmonary vasculature are within normal limits. Strandy lung base opacities most likely result of atelectasis. Lungs otherwise clear. IMPRESSION: Minimal bibasilar  atelectasis. Electronically Signed   By: Acquanetta Belling M.D.   On: 08/27/2021 12:09  ? ?CT Angio Chest PE W and/or Wo Contrast ? ?Result Date: 08/27/2021 ?CLINICAL DATA:  Chest pain, congestion, concern for PE, high probability EXAM: CT ANGIOGRAPHY CHEST WITH CONTRAST TECHNIQUE: Multidetector CT imaging of the chest was performed using the standard protocol during bolus administration of intravenous contrast. Multiplanar CT image reconstructions and MIPs were obtained to evaluate the vascular anatomy. RADIATION DOSE REDUCTION: This exam was performed according to the departmental dose-optimization program which includes automated exposure control, adjustment of the mA and/or kV according to patient size and/or use of iterative reconstruction technique. CONTRAST:  77mL OMNIPAQUE IOHEXOL 350 MG/ML SOLN COMPARISON:  08/27/2021 chest x-ray FINDINGS: Cardiovascular: Pulmonary arteries are well visualized and normal in caliber. No significant pulmonary artery filling defect or pulmonary embolus by CTA. Normal heart size. Pericardial thickening noted anteriorly. No pericardial effusion. Extensive calcific atherosclerosis of the aorta and major branch vessels. Negative for aneurysm or dissection. No mediastinal hemorrhage or hematoma. Major branch vessels are not well opacified because of the phase of imaging for PE study. Central venous structures appear patent. No veno-occlusive process. Native coronary atherosclerosis. Aortic valve calcifications present. Mediastinum/Nodes: No enlarged mediastinal, hilar, or axillary lymph nodes. Thyroid gland, trachea, and esophagus demonstrate no significant findings. Lungs/Pleura: Biapical pleural-parenchymal scarring and apical small blebs more pronounced on the left. Bilateral upper lobe predominant paraseptal emphysema and subpleural parenchymal scarring. Right lower lobe areas of bronchial wall thickening, nonspecific for bronchitis. Right middle lobe peribronchovascular low-density  nodule/branching nodularity measures 9 mm, image 62/6 which has fat attenuation low Hounsfield measurements suspicious for areas of a right middle lobe mucoid impaction. Additional areas of bibasilar subpleural interstitial changes/early fibrosis. Very small pleural effusions, slightly larger on the right layering posteriorly. No other pleural abnormality or pneumothorax. Upper Abdomen: Extensive upper abdominal atherosclerosis. Mesenteric and renal vasculature appear to remain patent. Gallbladder collapsed. Small punctate gallstone noted, image 101/4 in the fundus. Scattered colonic diverticulosis. No definite acute inflammatory process. Musculoskeletal: Degenerative changes throughout the spine. No chest wall soft tissue abnormality. No acute osseous finding. Negative for compression fracture. Sternum intact. Review of the MIP images confirms the above findings. IMPRESSION: Negative for significant acute pulmonary embolus by CTA. Extensive thoracic aortic atherosclerosis without acute aortic process, aneurysm or dissection. Native coronary atherosclerosis and aortic valve calcifications. Background biapical pleuroparenchymal scarring and paraseptal emphysema. Peribronchovascular branching fat density right middle lobe nodularity measuring up to 9 mm favored to represent mucoid impaction/plugging. Chronic appearing right lower lobe diffuse bronchial wall thickening/bronchitis. Chronic basilar interstitial changes and trace pleural effusions. Aortic Atherosclerosis (ICD10-I70.0) and Emphysema (ICD10-J43.9). Electronically Signed   By: Judie Petit.  Shick M.D.   On: 08/27/2021 14:39  ? ?US Venous Img Lower Bilateral ? ?Result  Date: 08/27/2021 ?CLINICAL DATA:  Shortness of breath, edema EXAM: BILATERAL LOWER EXTREMITY VENOUS DOPPLER ULTRASOUND TECHNIQUE: Gray-scale sonography with compression, as well as color and duplex ultrasound, were performed to evaluate the deep venous system(s) from the level of the common femoral vein  through the popliteal and proximal calf veins. COMPARISON:  None. FINDINGS: VENOUS Normal compressibility of the common femoral, superficial femoral, and popliteal veins, as well as the visualized calf veins.

## 2021-08-28 NOTE — Consult Note (Signed)
[facility] Cardiology ? ?CARDIOLOGY CONSULT NOTE  ?Patient ID: ?Joshua Rogers ?MRN: [id number] ?DOB/AGE: Dec 01, 1922 88 y.o. ? ?Admit date: 08/27/2021 ?Referring Physician Shawna Clamp ?Primary Physician Idelle Crouch, MD ?Primary Cardiologist [place] ?Reason for Consultation  ? ?HPI:  ?Joshua Rogers is a 88 year old male with history of hypertension, hyperlipidemia, type 2 diabetes, CKD 3, prior CVA, atrial fibrillation who was admitted to the hospital with cough and shortness of breath.  Cardiology is consulted for management of atrial fibrillation. ? ?Notably, the patient was discharged on 08/09/2021 when he presented with chest pain and palpitations.  During this admission his troponin peaked at 392 and was discovered to be in atrial fibrillation with RVR.  He was rate controlled with diltiazem and ultimately placed back on metoprolol.  He was started on Eliquis for anticoagulation.  Over the last few days prior to his presentation to the emergency department he described a cough productive of yellow sputum and shortness of breath that was progressively worsening.  He is being treated for acute bronchitis versus pneumonia with IV antibiotics and Solu-Medrol.  CTA of his chest was completed and showed no evidence of pulmonary embolism, but did show some peribronchovascular branching in the right middle lobe favored to represent mucoid impaction/plugging; no obvious pneumonia present. ? ?At the time of my evaluation he is sitting in bed eating his dinner.  He has no complaints at the time.  He states that he does not have chest pain similar to his last presentation, and has no palpitations.  He states that his cough productive of yellow sputum is improved compared to his admission but still present.  He does not feel short of breath. ? ? ?Review of systems complete and found to be negative unless listed above  ? ? ? ?Past Medical History:  ?Diagnosis Date  ? CKD (chronic kidney disease)   ? Diabetes ([place])   ? HLD  (hyperlipidemia)   ? Hypertension   ?  ?Past Surgical History:  ?Procedure Laterality Date  ? APPENDECTOMY    ? CATARACT EXTRACTION    ?  ?Medications Prior to Admission  ?Medication Sig Dispense Refill Last Dose  ? apixaban (ELIQUIS) 2.5 MG TABS tablet Take 1 tablet (2.5 mg total) by mouth 2 (two) times daily. 60 tablet 0 48+ hours at Unknown  ? aspirin 81 MG chewable tablet Chew 1 tablet (81 mg total) by mouth daily. 30 tablet 0   ? atorvastatin (LIPITOR) 40 MG tablet Take 1 tablet (40 mg total) by mouth every evening. 30 tablet 0 48+ hours at Unknown  ? finasteride (PROSCAR) 5 MG tablet Take 5 mg by mouth daily.   48+ hours at Unknown  ? levothyroxine (SYNTHROID) 50 MCG tablet Take 50 mcg by mouth daily.   48+ hours at Unknown  ? metoprolol tartrate (LOPRESSOR) 25 MG tablet Take 2 tablets (50 mg total) by mouth 2 (two) times daily. 60 tablet 0 48+ hours at Unknown  ? tamsulosin (FLOMAX) 0.4 MG CAPS capsule Take 0.4 mg by mouth daily.   48+ hours at Unknown  ? zolpidem (AMBIEN) 10 MG tablet Take 10 mg by mouth at bedtime as needed for sleep.   Unknown at PRN  ? ?Social History  ? ?Socioeconomic History  ? Marital status: Married  ?  Spouse name: Not on file  ? Number of children: Not on file  ? Years of education: Not on file  ? Highest education level: Not on file  ?Occupational History  ? Not on file  ?  Tobacco Use  ? Smoking status: Former  ? Smokeless tobacco: Never  ?Substance and Sexual Activity  ? Alcohol use: Yes  ? Drug use: Never  ? Sexual activity: Not on file  ?Other Topics Concern  ? Not on file  ?Social History Narrative  ? Not on file  ? ?Social Determinants of Health  ? ?Financial Resource Strain: Not on file  ?Food Insecurity: Not on file  ?Transportation Needs: Not on file  ?Physical Activity: Not on file  ?Stress: Not on file  ?Social Connections: Not on file  ?Intimate Partner Violence: Not on file  ?  ?Family History  ?Problem Relation Age of Onset  ? Stroke Father   ? Lung cancer Brother   ?  Stroke Brother   ?  ? ? ?Review of systems complete and found to be negative unless listed above  ? ? ? ? ?PHYSICAL EXAM ? ?General: Elderly male sitting in bed.  No acute distress. ?HEENT:  Normocephalic and atramatic ?Neck:  No JVD.  ?Lungs: Clear bilaterally to auscultation and percussion. ?Heart: IRRR . Normal S1 and S2 without gallops or murmurs.  ?Abdomen: Bowel sounds are positive, abdomen soft and non-tender  ?Msk:  Back normal, normal gait. Normal strength and tone for age. ?Extremities: No clubbing, cyanosis or edema.   ?Neuro: Alert and oriented X 3. ?Psych:  Good affect, responds appropriately ? ?Labs: ?  ?Lab Results  ?Component Value Date  ? WBC 11.1 (H) 08/28/2021  ? HGB 10.5 (L) 08/28/2021  ? HCT 31.8 (L) 08/28/2021  ? MCV 89.1 08/28/2021  ? PLT 261 08/28/2021  ?  ?Recent Labs  ?Lab 08/27/21 ?1117 08/28/21 ?[id number]  ?NA 134* 132*  ?K 4.5 4.2  ?CL 104 102  ?CO2 24 20*  ?BUN 16 19  ?CREATININE 1.37* 1.46*  ?CALCIUM 8.3* 8.3*  ?PROT 6.5  --   ?BILITOT 0.5  --   ?ALKPHOS 81  --   ?ALT 20  --   ?AST 28  --   ?GLUCOSE 202* 312*  ? ?Lab Results  ?Component Value Date  ? CKTOTAL 44 04/17/2012  ? CKMB 1.1 04/17/2012  ? TROPONINI <0.03 10/19/2018  ?  ?Lab Results  ?Component Value Date  ? CHOL 121 08/09/2021  ? ?Lab Results  ?Component Value Date  ? HDL 47 08/09/2021  ? ?Lab Results  ?Component Value Date  ? [place] 60 08/09/2021  ? ?Lab Results  ?Component Value Date  ? TRIG 70 08/09/2021  ? ?Lab Results  ?Component Value Date  ? CHOLHDL 2.6 08/09/2021  ? ?No results found for: LDLDIRECT  ?  ?Radiology: DG Chest 2 View ? ?Result Date: 08/27/2021 ?CLINICAL DATA:  Shortness of breath EXAM: CHEST - 2 VIEW COMPARISON:  08/08/2021 FINDINGS: Cardiomediastinal silhouette and pulmonary vasculature are within normal limits. Strandy lung base opacities most likely result of atelectasis. Lungs otherwise clear. IMPRESSION: Minimal bibasilar atelectasis. Electronically Signed   By: Miachel Roux M.D.   On: 08/27/2021 12:09   ? ?CT Angio Chest PE W and/or Wo Contrast ? ?Result Date: 08/27/2021 ?CLINICAL DATA:  Chest pain, congestion, concern for PE, high probability EXAM: CT ANGIOGRAPHY CHEST WITH CONTRAST TECHNIQUE: Multidetector CT imaging of the chest was performed using the standard protocol during bolus administration of intravenous contrast. Multiplanar CT image reconstructions and MIPs were obtained to evaluate the vascular anatomy. RADIATION DOSE REDUCTION: This exam was performed according to the departmental dose-optimization program which includes automated exposure control, adjustment of the mA and/or kV according to patient  size and/or use of iterative reconstruction technique. CONTRAST:  67mL OMNIPAQUE IOHEXOL 350 MG/ML SOLN COMPARISON:  08/27/2021 chest x-ray FINDINGS: Cardiovascular: Pulmonary arteries are well visualized and normal in caliber. No significant pulmonary artery filling defect or pulmonary embolus by CTA. Normal heart size. Pericardial thickening noted anteriorly. No pericardial effusion. Extensive calcific atherosclerosis of the aorta and major branch vessels. Negative for aneurysm or dissection. No mediastinal hemorrhage or hematoma. Major branch vessels are not well opacified because of the phase of imaging for PE study. Central venous structures appear patent. No veno-occlusive process. Native coronary atherosclerosis. Aortic valve calcifications present. Mediastinum/Nodes: No enlarged mediastinal, hilar, or axillary lymph nodes. Thyroid gland, trachea, and esophagus demonstrate no significant findings. Lungs/Pleura: Biapical pleural-parenchymal scarring and apical small blebs more pronounced on the left. Bilateral upper lobe predominant paraseptal emphysema and subpleural parenchymal scarring. Right lower lobe areas of bronchial wall thickening, nonspecific for bronchitis. Right middle lobe peribronchovascular low-density nodule/branching nodularity measures 9 mm, image 62/6 which has fat attenuation  low Hounsfield measurements suspicious for areas of a right middle lobe mucoid impaction. Additional areas of bibasilar subpleural interstitial changes/early fibrosis. Very small pleural effusions, slightly l

## 2021-08-28 NOTE — Progress Notes (Signed)
In/out cathed per MD order, drained 550 ml.  Met resistance and changed catheter to 16 Fr. Coude per patient request.  Resistance met during advancement of coude and unable to drain more urine.  Repeat bladder scan 12 ml ? ?

## 2021-08-28 NOTE — Progress Notes (Signed)
Notified on call provider of bladder scan of 875 ml.   ?

## 2021-08-29 DIAGNOSIS — J209 Acute bronchitis, unspecified: Secondary | ICD-10-CM | POA: Diagnosis not present

## 2021-08-29 DIAGNOSIS — I4891 Unspecified atrial fibrillation: Secondary | ICD-10-CM

## 2021-08-29 DIAGNOSIS — R748 Abnormal levels of other serum enzymes: Secondary | ICD-10-CM

## 2021-08-29 LAB — BASIC METABOLIC PANEL
Anion gap: 8 (ref 5–15)
BUN: 39 mg/dL — ABNORMAL HIGH (ref 8–23)
CO2: 20 mmol/L — ABNORMAL LOW (ref 22–32)
Calcium: 8.3 mg/dL — ABNORMAL LOW (ref 8.9–10.3)
Chloride: 105 mmol/L (ref 98–111)
Creatinine, Ser: 1.72 mg/dL — ABNORMAL HIGH (ref 0.61–1.24)
GFR, Estimated: 36 mL/min — ABNORMAL LOW (ref >60)
Glucose, Bld: 154 mg/dL — ABNORMAL HIGH (ref 70–99)
Potassium: 4.6 mmol/L (ref 3.5–5.1)
Sodium: 133 mmol/L — ABNORMAL LOW (ref 135–145)

## 2021-08-29 LAB — GLUCOSE, CAPILLARY
Glucose-Capillary: 130 mg/dL — ABNORMAL HIGH (ref 70–99)
Glucose-Capillary: 202 mg/dL — ABNORMAL HIGH (ref 70–99)
Glucose-Capillary: 128 mg/dL — ABNORMAL HIGH (ref 70–99)
Glucose-Capillary: 139 mg/dL — ABNORMAL HIGH (ref 70–99)

## 2021-08-29 LAB — MAGNESIUM: Magnesium: 2.3 mg/dL (ref 1.7–2.4)

## 2021-08-29 LAB — CBC
HCT: 31.7 % — ABNORMAL LOW (ref 39.0–52.0)
Hemoglobin: 10.5 g/dL — ABNORMAL LOW (ref 13.0–17.0)
MCH: 29.7 pg (ref 26.0–34.0)
MCHC: 33.1 g/dL (ref 30.0–36.0)
MCV: 89.8 fL (ref 80.0–100.0)
Platelets: 253 10*3/uL (ref 150–400)
RBC: 3.53 MIL/uL — ABNORMAL LOW (ref 4.22–5.81)
RDW: 15.1 % (ref 11.5–15.5)
WBC: 13.6 10*3/uL — ABNORMAL HIGH (ref 4.0–10.5)
nRBC: 0 % (ref 0.0–0.2)

## 2021-08-29 LAB — PHOSPHORUS: Phosphorus: 4.6 mg/dL (ref 2.5–4.6)

## 2021-08-29 LAB — CULTURE, RESPIRATORY W GRAM STAIN
Culture: NORMAL
Gram Stain: NONE SEEN

## 2021-08-29 MED ORDER — CHLORHEXIDINE GLUCONATE CLOTH 2 % EX PADS
6.0000 | MEDICATED_PAD | Freq: Every day | CUTANEOUS | Status: DC
  Administered 2021-08-29 – 2021-08-30 (×2): 6 via TOPICAL

## 2021-08-29 MED ORDER — METOPROLOL TARTRATE 25 MG PO TABS
37.5000 mg | ORAL_TABLET | Freq: Two times a day (BID) | ORAL | Status: DC
Start: 2021-08-29 — End: 2021-08-30
  Administered 2021-08-29 – 2021-08-30 (×2): 37.5 mg via ORAL
  Filled 2021-08-29 (×2): qty 2

## 2021-08-29 NOTE — TOC Initial Note (Signed)
Transition of Care (TOC) - Initial/Assessment Note  ? ? ?Patient Details  ?Name: Joshua Rogers ?MRN: [id number] ?Date of Birth: [birth year] ? ?Transition of Care (TOC) CM/SW Contact:    ?Alberteen Sam, LCSW ?Phone Number: ?08/29/2021, 10:37 AM ? ?Clinical Narrative:                 ? ?Patient and family agreeable to home health PT and OT preference for [facility] with [facility] updated and confirmed will service.  ? ?TOC will follow for medical readiness to discharge.  ? ?Expected Discharge Plan: [place] ?Barriers to Discharge: Continued Medical Work up ? ? ?Patient Goals and CMS Choice ?Patient states their goals for this hospitalization and ongoing recovery are:: to go home ?CMS Medicare.gov Compare Post Acute Care list provided to:: Patient ?Choice offered to / list presented to : Patient ? ?Expected Discharge Plan and Services ?Expected Discharge Plan: [place] ?  ?  ?  ?Living arrangements for the past 2 months: [place] ?                ?  ?  ?  ?  ?  ?HH Arranged: PT, OT ?[place] Agency: [facility] ([place]) ?Date HH Agency Contacted: 08/29/21 ?[place]: [id number] spoke with at [place]: Corene Cornea ? ?Prior Living Arrangements/Services ?Living arrangements for the past 2 months: [place] ?Lives with:: Spouse ?  ?Do you feel safe going back to the place where you live?: Yes      ?  ?  ?  ?  ? ?Activities of Daily Living ?Home Assistive Devices/Equipment: Cane (specify quad or straight) ?ADL Screening (condition at time of admission) ?Patient's cognitive ability adequate to safely complete daily activities?: Yes ?Is the patient deaf or have difficulty hearing?: Yes ?Does the patient have difficulty seeing, even when wearing glasses/contacts?: Yes ?Does the patient have difficulty concentrating, remembering, or making decisions?: No ?Patient able to express need for assistance with ADLs?: Yes ?Does the  patient have difficulty dressing or bathing?: Yes ?Independently performs ADLs?: Yes (appropriate for developmental age) ?Does the patient have difficulty walking or climbing stairs?: Yes ?Weakness of Legs: Both ?Weakness of Arms/Hands: Both ? ?Permission Sought/Granted ?  ?  ?   ?   ?   ?   ? ?Emotional Assessment ?  ?  ?  ?  ?  ?  ? ?Admission diagnosis:  SOB (shortness of breath) [R06.02] ?Bronchitis [J40] ?Atrial fibrillation with rapid ventricular response ([place]) [I48.91] ?Acute on chronic diastolic CHF (congestive heart failure) ([place]) [I50.33] ?Patient Active Problem List  ? Diagnosis Date Noted  ? SOB (shortness of breath) 08/27/2021  ? Acute on chronic diastolic CHF (congestive heart failure) ([place]) 08/27/2021  ? UTI (urinary tract infection) 08/27/2021  ? Elevated troponin 08/27/2021  ? Acute bronchitis 08/27/2021  ? Chronic diastolic CHF (congestive heart failure) ([place]) 08/27/2021  ? CAD (coronary artery disease) 08/27/2021  ? Atrial fibrillation with rapid ventricular response ([place])   ? Chest pain 08/08/2021  ? NSTEMI (non-ST elevated myocardial infarction) ([place]) 08/08/2021  ? Chronic kidney disease, stage 3a ([place]) 08/08/2021  ? Chronic atrial fibrillation with RVR ([place]) 08/08/2021  ? Hypothyroidism 08/08/2021  ? BPH (benign prostatic hyperplasia) 08/08/2021  ? Syncope 10/19/2018  ? Diabetes ([place]) 10/19/2018  ? HTN (hypertension) 10/19/2018  ? HLD (hyperlipidemia) 10/19/2018  ? Accelerated hypertension 10/19/2018  ? CKD (chronic kidney disease), stage III ([place]) 10/19/2018  ? ?  PCP:  Idelle Crouch, MD ?Pharmacy:   ?[id number] - [place], [place] - [address] ?[address] ?[place] [address] ?Phone: [phone] Fax: [phone] ? ? ? ? ?Social Determinants of Health (SDOH) Interventions ?  ? ?Readmission Risk Interventions ?   ? View : No data to display.  ?  ?  ?  ? ? ? ?

## 2021-08-29 NOTE — Plan of Care (Signed)
°  Problem: Education: °Goal: Ability to demonstrate management of disease process will improve °Outcome: Progressing °Goal: Ability to verbalize understanding of medication therapies will improve °Outcome: Progressing °Goal: Individualized Educational Video(s) °Outcome: Progressing °  °Problem: Activity: °Goal: Capacity to carry out activities will improve °Outcome: Progressing °  °Problem: Cardiac: °Goal: Ability to achieve and maintain adequate cardiopulmonary perfusion will improve °Outcome: Progressing °  °Problem: Activity: °Goal: Ability to tolerate increased activity will improve °Outcome: Progressing °  °Problem: Clinical Measurements: °Goal: Ability to maintain a body temperature in the normal range will improve °Outcome: Progressing °  °Problem: Respiratory: °Goal: Ability to maintain adequate ventilation will improve °Outcome: Progressing °Goal: Ability to maintain a clear airway will improve °Outcome: Progressing °  °Problem: Education: °Goal: Knowledge of General Education information will improve °Description: Including pain rating scale, medication(s)/side effects and non-pharmacologic comfort measures °Outcome: Progressing °  °Problem: Health Behavior/Discharge Planning: °Goal: Ability to manage health-related needs will improve °Outcome: Progressing °  °Problem: Clinical Measurements: °Goal: Ability to maintain clinical measurements within normal limits will improve °Outcome: Progressing °Goal: Will remain free from infection °Outcome: Progressing °Goal: Diagnostic test results will improve °Outcome: Progressing °Goal: Respiratory complications will improve °Outcome: Progressing °Goal: Cardiovascular complication will be avoided °Outcome: Progressing °  °Problem: Activity: °Goal: Risk for activity intolerance will decrease °Outcome: Progressing °  °Problem: Nutrition: °Goal: Adequate nutrition will be maintained °Outcome: Progressing °  °Problem: Coping: °Goal: Level of anxiety will  decrease °Outcome: Progressing °  °Problem: Elimination: °Goal: Will not experience complications related to bowel motility °Outcome: Progressing °Goal: Will not experience complications related to urinary retention °Outcome: Progressing °  °Problem: Pain Managment: °Goal: General experience of comfort will improve °Outcome: Progressing °  °Problem: Safety: °Goal: Ability to remain free from injury will improve °Outcome: Progressing °  °Problem: Skin Integrity: °Goal: Risk for impaired skin integrity will decrease °Outcome: Progressing °  °

## 2021-08-29 NOTE — Progress Notes (Addendum)
?PROGRESS NOTE ? ? ? ?Joshua Rogers  [id number] DOB: 11-13-1922 DOA: 08/27/2021 ?PCP: Idelle Crouch, MD  ? ? ?Brief Narrative:  ?This 88 years old male with PMH significant for hypertension, hyperlipidemia, type 2 diabetes, GERD, CKD stage IIIa, BPH, Diastolic CHF, CAD, NSTEMI, history of A-fib on Eliquis presented in the ED with complaints of cough and shortness of breath. ?Patient was recently hospitalized from 08/08/2021 to 08/09/2021 due to NSTEMI, patient was also found to have atrial fibrillation and started on Eliquis.  Patient reports has developed cough and progressive shortness of breath which is worsening.  He coughs up yellow sputum mixed with pink colored streaks yesterday. ?CTA negative for PE, peribronchial branching favored mucoid impaction/plugging.  Chronic appearing right lower lobe diffuse bronchial wall thickening bronchitis.  Patient was admitted for acute bronchitis started on empiric antibiotics.  Patient went into A-fib with RVR requiring IV Cardizem gtt.  Cardiology is consulted.  Heart rate is now controlled,  transitioned to PO metoprolol, Denies any further hemoptysis. ? ?Assessment & Plan: ?  ?Principal Problem: ?  Acute bronchitis ?Active Problems: ?  HTN (hypertension) ?  HLD (hyperlipidemia) ?  Chronic kidney disease, stage 3a ([place]) ?  Chronic atrial fibrillation with RVR (H[facility]) ?  Hypothyroidism ?  BPH (benign prostatic hyperplasia) ?  UTI (urinary tract infection) ?  Elevated troponin ?  Chronic diastolic CHF (congestive heart failure) ([place]) ?  CAD (coronary artery disease) ? ?Suspected acute bronchitis: ?Patient presented with shortness of breath and productive cough mixed with pink-colored phlegym. ?CTA negative for PE,  possibly due to bronchitis. ?Patient does not meet sepsis criteria, sepsis ruled out. ?Continue empiric antibiotic IV Rocephin and Zithromax. ?Patient has received Solu-Medrol IV 125 mg once. ?Continue bronchodilators,  continue Mucinex for cough. ?Urine  Legionella and strep pneumonia antigen negative. ?Follow up Blood cultures. Procalcitonin less than 0.10. ?He denies further hemoptysis. ? ?Essential hypertension: ?Continue metoprolol, ?Continue IV hydralazine as needed ? ?Hyperlipidemia ?Continue Lipitor ? ?CKD stage IIIa: ?Serum creatinine at baseline. ?Continue to monitor serum creatinine. ? ?Atrial fibrillation with RVR: ?Went into A-fib with RVR requiring IV Cardizem. ?Cardiology is consulted.  Heart rate is now controlled.  Transitioned to p.o. metoprolol. ?Hold anticoagulation due to hemoptysis.  Cardiology signed off. ? ?Hypothyroidism: ?Continue Synthroid ? ?BPH: ?Continue Flomax/Proscar ?  ?Possible UTI: ?Continue Rocephin.  Urine culture grew E. coli, sensitivity pending. ? ?Elevated troponins :  ?History of CAD ?Patient denies any chest pain, likely demand ischemia.  ?Less likely ACS , troponin level 47, 57.   ?Hold aspirin due to possible hemoptysis. ?Continue Lipitor. ?  ?Chronic diastolic CHF: ?2D echo on 08/09/2021 showed EF 60 to 65% with grade 3 diastolic dysfunction.   ?BNP is slightly elevated at 419, No edema,  does not seem to have CHF exacerbation. ?Restart low-dose oral Lasix 20 mg daily. ? ? ?DVT prophylaxis: SCDs ?Code Status: DNR ?Family Communication: No family at bed side. ?Disposition Plan: ? ?Admitted for suspected acute bronchitis requiring IV antibiotics.  Went into A-fib requiring IV Cardizem,  now heart rate is controlled ,  transitioned  to oral metoprolol. ? ?Anticipated discharge home on 4 /14/23. ? ? ?Consultants:  ?None ? ?Procedures: CTA chest ?Antimicrobials:  ?Anti-infectives (From admission, onward)  ? ? Start     Dose/Rate Route Frequency Ordered Stop  ? 08/28/21 1700  cefTRIAXone (ROCEPHIN) 1 g in sodium chloride 0.9 % 100 mL IVPB       ? 1 g ?200 mL/hr over 30 Minutes  Intravenous Every 24 hours 08/27/21 1816    ? 08/28/21 1000  azithromycin (ZITHROMAX) tablet 500 mg       ? 500 mg Oral Daily 08/27/21 1816    ? 08/27/21  1545  cefTRIAXone (ROCEPHIN) 1 g in sodium chloride 0.9 % 100 mL IVPB       ? 1 g ?200 mL/hr over 30 Minutes Intravenous  Once 08/27/21 1537 08/27/21 1731  ? 08/27/21 1545  azithromycin (ZITHROMAX) tablet 500 mg       ? 500 mg Oral  Once 08/27/21 1537 08/27/21 1652  ? ?  ?  ? ?Subjective: ?Patient was seen and examined at bedside.  Overnight events noted.  ?Patient reports feeling very much improved,  asks when he can be discharged. ?Patient denies any further hemoptysis.  Heart rate is now controlled. ? ?Objective: ?Vitals:  ? 08/28/21 2339 08/29/21 0434 08/29/21 0725 08/29/21 1153  ?BP: 114/71 132/70 (!) 141/75 129/67  ?Pulse: 90 65 76 73  ?Resp: 19 16 18 18   ?Temp: 98.5 ?F (36.9 ?C) 97.8 ?F (36.6 ?C) 97.7 ?F (36.5 ?C) (!) 97.4 ?F (36.3 ?C)  ?TempSrc:  Oral Oral Oral  ?SpO2: 97% 96% 97% 97%  ?Weight:  70.6 kg    ?Height:      ? ? ?Intake/Output Summary (Last 24 hours) at 08/29/2021 1310 ?Last data filed at 08/29/2021 0500 ?Gross per 24 hour  ?Intake 660.21 ml  ?Output 700 ml  ?Net -39.79 ml  ? ?Filed Weights  ? 08/27/21 1047 08/29/21 0434  ?Weight: 65.8 kg 70.6 kg  ? ? ?Examination: ? ?General exam: Appears comfortable, not in any acute distress, deconditioned. ?Respiratory system: CTA bilaterally, respiratory effort normal, no wheezing, no crackles. ?Cardiovascular system: S1 & S2 heard, regular rate and rhythm, no murmur. ?Gastrointestinal system: Abdomen is soft, non tender, non distended, BS+ ?Central nervous system: Alert and oriented x 3. No focal neurological deficits. ?Extremities: No edema, no cyanosis, no clubbing. ?Skin: No rashes, lesions or ulcers ?Psychiatry: Judgement and insight appear normal. Mood & affect appropriate.  ? ? ? ?Data Reviewed: I have personally reviewed following labs and imaging studies ? ?CBC: ?Recent Labs  ?Lab 08/27/21 ?1117 08/28/21 ?[id number] 08/29/21 ?[id number]  ?WBC 7.9 11.1* 13.6*  ?NEUTROABS 6.4  --   --   ?HGB 10.6* 10.5* 10.5*  ?HCT 33.3* 31.8* 31.7*  ?MCV 93.3 89.1 89.8  ?PLT 244  261 253  ? ?Basic Metabolic Panel: ?Recent Labs  ?Lab 08/27/21 ?1117 08/28/21 ?[id number] 08/29/21 ?[id number]  ?NA 134* 132* 133*  ?K 4.5 4.2 4.6  ?CL 104 102 105  ?CO2 24 20* 20*  ?GLUCOSE 202* 312* 154*  ?BUN 16 19 39*  ?CREATININE 1.37* 1.46* 1.72*  ?CALCIUM 8.3* 8.3* 8.3*  ?MG  --   --  2.3  ?PHOS  --   --  4.6  ? ?GFR: ?Estimated Creatinine Clearance: 24.5 mL/min (A) (by C-G formula based on SCr of 1.72 mg/dL (H)). ?Liver Function Tests: ?Recent Labs  ?Lab 08/27/21 ?1117  ?AST 28  ?ALT 20  ?ALKPHOS 81  ?BILITOT 0.5  ?PROT 6.5  ?ALBUMIN 2.7*  ? ?No results for input(s): LIPASE, AMYLASE in the last 168 hours. ?No results for input(s): AMMONIA in the last 168 hours. ?Coagulation Profile: ?Recent Labs  ?Lab 08/27/21 ?1117  ?INR 1.1  ? ?Cardiac Enzymes: ?No results for input(s): CKTOTAL, CKMB, CKMBINDEX, TROPONINI in the last 168 hours. ?BNP (last 3 results) ?No results for input(s): PROBNP in the last 8760 hours. ?HbA1C: ?No results for  input(s): HGBA1C in the last 72 hours. ?CBG: ?Recent Labs  ?Lab 08/28/21 ?1216 08/28/21 ?1706 08/28/21 ?2002 08/29/21 ?0726 08/29/21 ?1155  ?GLUCAP 296* 136* 163* 130* 202*  ? ?Lipid Profile: ?No results for input(s): CHOL, HDL, LDLCALC, TRIG, CHOLHDL, LDLDIRECT in the last 72 hours. ?Thyroid Function Tests: ?No results for input(s): TSH, T4TOTAL, FREET4, T3FREE, THYROIDAB in the last 72 hours. ?Anemia Panel: ?No results for input(s): VITAMINB12, FOLATE, FERRITIN, TIBC, IRON, RETICCTPCT in the last 72 hours. ?Sepsis Labs: ?Recent Labs  ?Lab 08/27/21 ?[id number]  ?PROCALCITON <0.10  ? ? ?Recent Results (from the past 240 hour(s))  ?Resp Panel by RT-PCR (Flu A&B, Covid) Expectorated Sputum     Status: None  ? Collection Time: 08/27/21  3:38 PM  ? Specimen: Expectorated Sputum; Nasopharyngeal(NP) swabs in vial transport medium  ?Result Value Ref Range Status  ? SARS Coronavirus 2 by RT PCR NEGATIVE NEGATIVE Final  ?  Comment: (NOTE) ?SARS-CoV-2 target nucleic acids are NOT DETECTED. ? ?The SARS-CoV-2  RNA is generally detectable in upper respiratory ?specimens during the acute phase of infection. The lowest ?concentration of SARS-CoV-2 viral copies this assay can detect is ?138 copies/mL. A negative re

## 2021-08-29 NOTE — Progress Notes (Signed)
Mobility Specialist - Progress Note ? ? ? 08/29/21 1544  ?Mobility  ?Activity Stood at bedside  ?Range of Motion/Exercises Right leg;Left leg  ?Level of Assistance Minimal assist, patient does 75% or more  ?Assistive Device None  ?Distance Ambulated (ft) 0 ft  ?Activity Response Tolerated well  ?$Mobility charge 1 Mobility  ? ? ?Pre-mobility: 85 HR, 94% SpO2 ?During mobility: 78 HR, 98% SpO2 ? ?Pt sitting in chair upon arrival using RA. Pt completes 5 STS x 2 with MinA and voices no complaints. Pt is left in chair with alarm set and needs in reach. ? ?Clarisa Schools ?Mobility Specialist ?08/29/21, 3:49 PM ? ? ? ? ?

## 2021-08-29 NOTE — Progress Notes (Signed)
Physical Therapy Treatment ?Patient Details ?Name: Joshua Rogers ?MRN: [id number] ?DOB: 1922/09/23 ?Today's Date: 08/29/2021 ? ? ?History of Present Illness Joshua Rogers is a 88 y.o. male with medical history significant of  hypertension, hyperlipidemia, diet-controlled diabetes mellitus, GERD, CKD stage IIIa, BPH, dCHF, CAD, NSTEMI, A fib on Eliquis, who present with cough and SOB. Patient was recently hospitalized from 3/23 - 3/24 due to non-STEMI.  Patient also was found to have atrial fibrillation and started on Eliquis. Patient states that in the past several days, he has been having cough and shortness of breath which has been progressively worsening. Pt admitted for possible acute bronchitis ? ?  ?PT Comments  ? ? Pt received upright in bed agreeable to PT services. HR maintaining WNL at rest in mid 70's BPM. Pt supervision to stand and minguard to amb with SPC ~ 70-75' with excellent use of SPC with minor unsteadiness. Able to progress to supervision during gait with pt returning to sitting in recliner performing LE therex. Max HR with ambulation ranging from 85-95 BPM with pt reporting minor SOB with SPO2 at 98% on RA. Pt recovers with seated rest with all needs in reach. D/c recs remain appropriate as pt progressing in safe ambulation at baseline level. Will continue to follow acutely to maintain strength/mobility. ?   ?Recommendations for follow up therapy are one component of a multi-disciplinary discharge planning process, led by the attending physician.  Recommendations may be updated based on patient status, additional functional criteria and insurance authorization. ? ?Follow Up Recommendations ? Home health PT ?  ?  ?Assistance Recommended at Discharge Frequent or constant Supervision/Assistance  ?Patient can return home with the following Assistance with cooking/housework;Assist for transportation;Help with stairs or ramp for entrance ?  ?Equipment Recommendations ? None recommended by PT  ?   ?Recommendations for Other Services   ? ? ?  ?Precautions / Restrictions Precautions ?Precautions: Fall ?Restrictions ?Weight Bearing Restrictions: No  ?  ? ?Mobility ? Bed Mobility ?  ?  ?  ?  ?  ?  ?  ?General bed mobility comments: NT. In recliner pre and post session ?Patient Response: Cooperative ? ?Transfers ?Overall transfer level: Needs assistance ?Equipment used: Straight cane ?Transfers: Sit to/from Stand ?Sit to Stand: Supervision ?  ?  ?  ?  ?  ?General transfer comment: stands without AD at bedside ?  ? ?Ambulation/Gait ?Ambulation/Gait assistance: Min guard ?Gait Distance (Feet): 70 Feet ?Assistive device: Straight cane ?Gait Pattern/deviations: Step-through pattern, Narrow base of support ?  ?  ?  ?General Gait Details: Good stability with correct use of SPC ? ? ?Stairs ?  ?  ?  ?  ?  ? ? ?Wheelchair Mobility ?  ? ?Modified Rankin (Stroke Patients Only) ?  ? ? ?  ?Balance Overall balance assessment: Needs assistance ?Sitting-balance support: No upper extremity supported, Feet supported ?Sitting balance-Leahy Scale: Good ?  ?  ?Standing balance support: No upper extremity supported, During functional activity ?Standing balance-Leahy Scale: Fair ?  ?  ?  ?  ?  ?  ?  ?  ?  ?  ?  ?  ?  ? ?  ?Cognition Arousal/Alertness: Awake/alert ?Behavior During Therapy: [facility] for tasks assessed/performed ?Overall Cognitive Status: Within Functional Limits for tasks assessed ?  ?  ?  ?  ?  ?  ?  ?  ?  ?  ?  ?  ?  ?  ?  ?  ?  ?  ?  ? ?  ?  Exercises General Exercises - Lower Extremity ?Long Arc Quad: AROM, Strengthening, Both, 10 reps, Seated ?Hip Flexion/Marching: AROM, Strengthening, Both, 10 reps, Seated ?Toe Raises: AROM, Strengthening, Seated, 10 reps, Both ?Heel Raises: AROM, Seated, Strengthening, Both, 10 reps ? ?  ?General Comments General comments (skin integrity, edema, etc.): HR at mid 70's at rest up to 85-95 BPM with ambulation ?  ?  ? ?Pertinent Vitals/Pain Pain Assessment ?Pain Assessment: No/denies pain   ? ? ?Home Living   ?  ?  ?  ?  ?  ?  ?  ?  ?  ?   ?  ?Prior Function    ?  ?  ?   ? ?PT Goals (current goals can now be found in the care plan section) Acute Rehab PT Goals ?Patient Stated Goal: to go home ?PT Goal Formulation: With patient ?Time For Goal Achievement: 09/11/21 ?Potential to Achieve Goals: Good ?Progress towards PT goals: Progressing toward goals ? ?  ?Frequency ? ? ? Min 2X/week ? ? ? ?  ?PT Plan Current plan remains appropriate  ? ? ?Co-evaluation   ?  ?  ?  ?  ? ?  ?AM-PAC PT "6 Clicks" Mobility   ?Outcome Measure ? Help needed turning from your back to your side while in a flat bed without using bedrails?: A Little ?Help needed moving from lying on your back to sitting on the side of a flat bed without using bedrails?: A Little ?Help needed moving to and from a bed to a chair (including a wheelchair)?: A Little ?Help needed standing up from a chair using your arms (e.g., wheelchair or bedside chair)?: A Little ?Help needed to walk in hospital room?: A Little ?Help needed climbing 3-5 steps with a railing? : A Lot ?6 Click Score: 17 ? ?  ?End of Session Equipment Utilized During Treatment: Gait belt ?Activity Tolerance: Patient tolerated treatment well ?Patient left: in chair;with chair alarm set ?Nurse Communication: Mobility status ?PT Visit Diagnosis: Unsteadiness on feet (R26.81);Muscle weakness (generalized) (M62.81) ?  ? ? ?Time: [id number] ?PT Time Calculation (min) (ACUTE ONLY): 14 min ? ?Charges:  $Therapeutic Exercise: 8-22 mins          ?          ?Salem Caster. Fairly IV, PT, DPT ?Physical Therapist- [place]  ?[facility]  ?08/29/2021, 1:04 PM ? ?

## 2021-08-29 NOTE — Progress Notes (Signed)
[facility] ? ?CARDIOLOGY CONSULT NOTE  ?Patient ID: ?Joshua ErikssonAndrew C Bass ?MRN: [id number] ?DOB/AGE: 86/12/1922 86 y.o. ? ?Admit date: 08/27/2021 ?Referring Physician Cipriano BunkerPardeep Kumar ?Primary Physician Marguarite ArbourSparks, Jeffrey D, MD ?Primary Cardiologist [place] ?Reason for Consultation  ? ?HPI:  ?Joshua Rogers is a 88 year old male with history of hypertension, hyperlipidemia, type 2 diabetes, CKD 3, prior CVA, atrial fibrillation who was admitted to the hospital with cough and shortness of breath.  Cardiology is consulted for management of atrial fibrillation. ? ?Interval History: ?-HR improved to the 70s/80s in AF today ?-still with significant productive cough with yellow sputum. Denies hemoptysis, no SOB, no chest pain or palpitations ? ?Review of systems complete and found to be negative unless listed above  ? ?Past Medical History:  ?Diagnosis Date  ? CKD (chronic kidney disease)   ? Diabetes (HCC)   ? HLD (hyperlipidemia)   ? Hypertension   ?  ?Past Surgical History:  ?Procedure Laterality Date  ? APPENDECTOMY    ? CATARACT EXTRACTION    ?  ?Medications Prior to Admission  ?Medication Sig Dispense Refill Last Dose  ? apixaban (ELIQUIS) 2.5 MG TABS tablet Take 1 tablet (2.5 mg total) by mouth 2 (two) times daily. 60 tablet 0 48+ hours at Unknown  ? aspirin 81 MG chewable tablet Chew 1 tablet (81 mg total) by mouth daily. 30 tablet 0   ? atorvastatin (LIPITOR) 40 MG tablet Take 1 tablet (40 mg total) by mouth every evening. 30 tablet 0 48+ hours at Unknown  ? finasteride (PROSCAR) 5 MG tablet Take 5 mg by mouth daily.   48+ hours at Unknown  ? levothyroxine (SYNTHROID) 50 MCG tablet Take 50 mcg by mouth daily.   48+ hours at Unknown  ? metoprolol tartrate (LOPRESSOR) 25 MG tablet Take 2 tablets (50 mg total) by mouth 2 (two) times daily. 60 tablet 0 48+ hours at Unknown  ? tamsulosin (FLOMAX) 0.4 MG CAPS capsule Take 0.4 mg by mouth daily.   48+ hours at Unknown  ? zolpidem (AMBIEN) 10 MG tablet Take 10 mg by mouth at  bedtime as needed for sleep.   Unknown at PRN  ? ?Social History  ? ?Socioeconomic History  ? Marital status: Married  ?  Spouse name: Not on file  ? Number of children: Not on file  ? Years of education: Not on file  ? Highest education level: Not on file  ?Occupational History  ? Not on file  ?Tobacco Use  ? Smoking status: Former  ? Smokeless tobacco: Never  ?Substance and Sexual Activity  ? Alcohol use: Yes  ? Drug use: Never  ? Sexual activity: Not on file  ?Other Topics Concern  ? Not on file  ?Social History Narrative  ? Not on file  ? ?Social Determinants of Health  ? ?Financial Resource Strain: Not on file  ?Food Insecurity: Not on file  ?Transportation Needs: Not on file  ?Physical Activity: Not on file  ?Stress: Not on file  ?Social Connections: Not on file  ?Intimate Partner Violence: Not on file  ?  ?Family History  ?Problem Relation Age of Onset  ? Stroke Father   ? Lung cancer Brother   ? Stroke Brother   ?  ? ? ?Review of systems complete and found to be negative unless listed above  ? ? ? ? ?PHYSICAL EXAM ? ?General: Elderly and frail Caucasian male sitting in recliner  No acute distress. ?HEENT:  Normocephalic and atramatic ?Neck:  No JVD.  ?Lungs:  normal respiratory effort on room air, coarse breath sounds to ascultation. Wet cough with deep inspiration.  ?Heart: IRRR . Normal S1 and S2 without gallops. 3/6 systolic murmur.  ?Abdomen: nondistended appearing.   ?Msk: Normal strength and tone for age. ?Extremities: No clubbing, cyanosis. Trace LE edema.   ?Neuro: Alert and oriented X 3. ?Psych:  responds appropriately ? ?Labs: ?  ?Lab Results  ?Component Value Date  ? WBC 13.6 (H) 08/29/2021  ? HGB 10.5 (L) 08/29/2021  ? HCT 31.7 (L) 08/29/2021  ? MCV 89.8 08/29/2021  ? PLT 253 08/29/2021  ?  ?Recent Labs  ?Lab 08/27/21 ?1117 08/28/21 ?3419 08/29/21 ?3790  ?NA 134*   < > 133*  ?K 4.5   < > 4.6  ?CL 104   < > 105  ?CO2 24   < > 20*  ?BUN 16   < > 39*  ?CREATININE 1.37*   < > 1.72*  ?CALCIUM 8.3*   <  > 8.3*  ?PROT 6.5  --   --   ?BILITOT 0.5  --   --   ?ALKPHOS 81  --   --   ?ALT 20  --   --   ?AST 28  --   --   ?GLUCOSE 202*   < > 154*  ? < > = values in this interval not displayed.  ? ? ?Lab Results  ?Component Value Date  ? CKTOTAL 44 04/17/2012  ? CKMB 1.1 04/17/2012  ? TROPONINI <0.03 10/19/2018  ? ?  ?Lab Results  ?Component Value Date  ? CHOL 121 08/09/2021  ? ?Lab Results  ?Component Value Date  ? HDL 47 08/09/2021  ? ?Lab Results  ?Component Value Date  ? LDLCALC 60 08/09/2021  ? ?Lab Results  ?Component Value Date  ? TRIG 70 08/09/2021  ? ?Lab Results  ?Component Value Date  ? CHOLHDL 2.6 08/09/2021  ? ?No results found for: LDLDIRECT  ?  ?Radiology: DG Chest 2 View ? ?Result Date: 08/27/2021 ?CLINICAL DATA:  Shortness of breath EXAM: CHEST - 2 VIEW COMPARISON:  08/08/2021 FINDINGS: Cardiomediastinal silhouette and pulmonary vasculature are within normal limits. Strandy lung base opacities most likely result of atelectasis. Lungs otherwise clear. IMPRESSION: Minimal bibasilar atelectasis. Electronically Signed   By: Acquanetta Belling M.D.   On: 08/27/2021 12:09  ? ?CT Angio Chest PE W and/or Wo Contrast ? ?Result Date: 08/27/2021 ?CLINICAL DATA:  Chest pain, congestion, concern for PE, high probability EXAM: CT ANGIOGRAPHY CHEST WITH CONTRAST TECHNIQUE: Multidetector CT imaging of the chest was performed using the standard protocol during bolus administration of intravenous contrast. Multiplanar CT image reconstructions and MIPs were obtained to evaluate the vascular anatomy. RADIATION DOSE REDUCTION: This exam was performed according to the departmental dose-optimization program which includes automated exposure control, adjustment of the mA and/or kV according to patient size and/or use of iterative reconstruction technique. CONTRAST:  81mL OMNIPAQUE IOHEXOL 350 MG/ML SOLN COMPARISON:  08/27/2021 chest x-ray FINDINGS: Cardiovascular: Pulmonary arteries are well visualized and normal in caliber. No  significant pulmonary artery filling defect or pulmonary embolus by CTA. Normal heart size. Pericardial thickening noted anteriorly. No pericardial effusion. Extensive calcific atherosclerosis of the aorta and major branch vessels. Negative for aneurysm or dissection. No mediastinal hemorrhage or hematoma. Major branch vessels are not well opacified because of the phase of imaging for PE study. Central venous structures appear patent. No veno-occlusive process. Native coronary atherosclerosis. Aortic valve calcifications present. Mediastinum/Nodes: No enlarged mediastinal, hilar, or axillary lymph nodes.  Thyroid gland, trachea, and esophagus demonstrate no significant findings. Lungs/Pleura: Biapical pleural-parenchymal scarring and apical small blebs more pronounced on the left. Bilateral upper lobe predominant paraseptal emphysema and subpleural parenchymal scarring. Right lower lobe areas of bronchial wall thickening, nonspecific for bronchitis. Right middle lobe peribronchovascular low-density nodule/branching nodularity measures 9 mm, image 62/6 which has fat attenuation low Hounsfield measurements suspicious for areas of a right middle lobe mucoid impaction. Additional areas of bibasilar subpleural interstitial changes/early fibrosis. Very small pleural effusions, slightly larger on the right layering posteriorly. No other pleural abnormality or pneumothorax. Upper Abdomen: Extensive upper abdominal atherosclerosis. Mesenteric and renal vasculature appear to remain patent. Gallbladder collapsed. Small punctate gallstone noted, image 101/4 in the fundus. Scattered colonic diverticulosis. No definite acute inflammatory process. Musculoskeletal: Degenerative changes throughout the spine. No chest wall soft tissue abnormality. No acute osseous finding. Negative for compression fracture. Sternum intact. Review of the MIP images confirms the above findings. IMPRESSION: Negative for significant acute pulmonary  embolus by CTA. Extensive thoracic aortic atherosclerosis without acute aortic process, aneurysm or dissection. Native coronary atherosclerosis and aortic valve calcifications. Background biapical pleuroparenchymal sca

## 2021-08-30 DIAGNOSIS — J209 Acute bronchitis, unspecified: Secondary | ICD-10-CM | POA: Diagnosis not present

## 2021-08-30 LAB — COMPREHENSIVE METABOLIC PANEL
ALT: 18 U/L (ref 0–44)
AST: 19 U/L (ref 15–41)
Albumin: 2.6 g/dL — ABNORMAL LOW (ref 3.5–5.0)
Alkaline Phosphatase: 81 U/L (ref 38–126)
Anion gap: 7 (ref 5–15)
BUN: 37 mg/dL — ABNORMAL HIGH (ref 8–23)
CO2: 23 mmol/L (ref 22–32)
Calcium: 8.3 mg/dL — ABNORMAL LOW (ref 8.9–10.3)
Chloride: 107 mmol/L (ref 98–111)
Creatinine, Ser: 1.48 mg/dL — ABNORMAL HIGH (ref 0.61–1.24)
GFR, Estimated: 43 mL/min — ABNORMAL LOW (ref >60)
Glucose, Bld: 136 mg/dL — ABNORMAL HIGH (ref 70–99)
Potassium: 4.1 mmol/L (ref 3.5–5.1)
Sodium: 137 mmol/L (ref 135–145)
Total Bilirubin: 0.4 mg/dL (ref 0.3–1.2)
Total Protein: 5.9 g/dL — ABNORMAL LOW (ref 6.5–8.1)

## 2021-08-30 LAB — LEGIONELLA PNEUMOPHILA SEROGP 1 UR AG: L. pneumophila Serogp 1 Ur Ag: NEGATIVE

## 2021-08-30 LAB — URINE CULTURE: Culture: >=100000 — AB

## 2021-08-30 LAB — CBC
HCT: 35.8 % — ABNORMAL LOW (ref 39.0–52.0)
Hemoglobin: 11.5 g/dL — ABNORMAL LOW (ref 13.0–17.0)
MCH: 29.3 pg (ref 26.0–34.0)
MCHC: 32.1 g/dL (ref 30.0–36.0)
MCV: 91.3 fL (ref 80.0–100.0)
Platelets: 290 10*3/uL (ref 150–400)
RBC: 3.92 MIL/uL — ABNORMAL LOW (ref 4.22–5.81)
RDW: 15 % (ref 11.5–15.5)
WBC: 9.7 10*3/uL (ref 4.0–10.5)
nRBC: 0 % (ref 0.0–0.2)

## 2021-08-30 LAB — GLUCOSE, CAPILLARY: Glucose-Capillary: 144 mg/dL — ABNORMAL HIGH (ref 70–99)

## 2021-08-30 LAB — PHOSPHORUS: Phosphorus: 3.7 mg/dL (ref 2.5–4.6)

## 2021-08-30 LAB — MAGNESIUM: Magnesium: 2.2 mg/dL (ref 1.7–2.4)

## 2021-08-30 MED ORDER — CEFADROXIL 500 MG PO CAPS: 500.0000 mg | ORAL_CAPSULE | Freq: Two times a day (BID) | ORAL | 0 refills | Status: AC

## 2021-08-30 MED ORDER — METOPROLOL TARTRATE 25 MG PO TABS
12.5000 mg | ORAL_TABLET | Freq: Once | ORAL | Status: AC
  Administered 2021-08-30: 12.5 mg via ORAL
  Filled 2021-08-30: qty 1

## 2021-08-30 MED ORDER — DM-GUAIFENESIN ER 30-600 MG PO TB12: 1.0000 | ORAL_TABLET | Freq: Two times a day (BID) | ORAL | 0 refills | Status: DC | PRN

## 2021-08-30 MED ORDER — METOPROLOL TARTRATE 50 MG PO TABS
50.0000 mg | ORAL_TABLET | Freq: Two times a day (BID) | ORAL | Status: DC
Start: 2021-08-30 — End: 2021-08-30

## 2021-08-30 MED ORDER — FUROSEMIDE 20 MG PO TABS: 20.0000 mg | ORAL_TABLET | Freq: Every day | ORAL | 1 refills | Status: DC

## 2021-08-30 MED ORDER — CEFADROXIL 500 MG PO CAPS: 500.0000 mg | ORAL_CAPSULE | Freq: Two times a day (BID) | ORAL | Status: DC

## 2021-08-30 NOTE — Discharge Instructions (Signed)
Advised to follow-up with primary care physician in 1 week. ?Advised to follow-up with urology as scheduled. ?Advised to take metoprolol 50 mg twice daily and Eliquis for atrial fibrillation. ?

## 2021-08-30 NOTE — Discharge Summary (Addendum)
Physician Discharge Summary  ?Joshua Rogers [id number] DOB: 10/14/1922 DOA: 08/27/2021 ? ?PCP: Idelle Crouch, MD ? ?Admit date: 08/27/2021 ? ?Discharge date: 08/30/2021 ? ?Admitted From: Home. ?Disposition: Home Health services. ? ?Recommendations for Outpatient Follow-up:  ?Follow up with PCP in 1-2 weeks. ?Please obtain BMP/CBC in one week ?Advised to follow-up with urology as scheduled. ?Advised to take metoprolol 50 mg twice daily and Eliquis for atrial fibrillation. ?Advised to take cefadroxil 500 mg twice daily for 7 days for E. coli UTI ? ?Home Health: Home PT/ OT /RN ?Equipment/Devices: None ? ?Discharge Condition: Stable ?CODE STATUS:DNR ?Diet recommendation: Heart Healthy ? ?Brief Summary/ Hospital course: ?This 88 years old male with PMH significant for hypertension, hyperlipidemia, type 2 diabetes, GERD, CKD stage IIIa, BPH, Diastolic CHF, CAD, NSTEMI, history of A-fib on Eliquis presented in the ED with complaints of cough and shortness of breath. ?Patient was recently hospitalized from 08/08/2021 to 08/09/2021 due to NSTEMI, patient was also found to have atrial fibrillation and started on Eliquis.  Patient reports he has developed cough and progressive shortness of breath which is worsening.  He coughs up yellow sputum mixed with pink colored streaks yesterday. ?CTA negative for PE, peribronchial branching favored mucoid impaction/plugging.  Chronic appearing right lower lobe diffuse bronchial wall thickening consistent with bronchitis.  Patient was admitted for acute bronchitis,  started on empiric antibiotics.  Patient went into A-fib with RVR requiring IV Cardizem gtt.  Cardiology was consulted. Heart rate is now controlled,  transitioned to PO metoprolol, Denies any further hemoptysis.  Patient resumed on Eliquis.  Patient also does self-catheterization for chronic urinary retention.  Urology was also consulted.  Foley catheter was removed.  Patient will continue self-catheterization and  follow-up with urology outpatient.  PT and OT recommended home with home health services, which were arranged.  Patient metoprolol dose was increased from 37.5 to 50 mg twice daily for heart rate control.  Urine cultures grew E. coli sensitive to cefadroxil. Patient is being discharged home. ? ?  ?Discharge Diagnoses:  ?Principal Problem: ?  Acute bronchitis ?Active Problems: ?  HTN (hypertension) ?  HLD (hyperlipidemia) ?  Chronic kidney disease, stage 3a ([place]) ?  Chronic atrial fibrillation with RVR ([facility]) ?  Hypothyroidism ?  BPH (benign prostatic hyperplasia) ?  UTI (urinary tract infection) ?  Elevated troponin ?  Chronic diastolic CHF (congestive heart failure) ([place]) ?  CAD (coronary artery disease) ? ?Suspected acute bronchitis: ?Patient presented with shortness of breath and productive cough mixed with pink-colored phlegym. ?CTA negative for PE,  possibly due to bronchitis. ?Patient does not meet sepsis criteria, sepsis ruled out. ?Continue empiric antibiotic IV Rocephin and Zithromax. ?Patient has received Solu-Medrol IV 125 mg once. ?Continue bronchodilators,  continue Mucinex for cough. ?Urine Legionella and strep pneumonia antigen negative. ?Follow up Blood cultures. Procalcitonin less than 0.10. ?He denies further hemoptysis.  Completed Zithromax for 5 days. ?  ?Essential hypertension: ?Continue metoprolol, ?Continue IV hydralazine as needed ?  ?Hyperlipidemia ?Continue Lipitor ?  ?CKD stage IIIa: ?Serum creatinine at baseline. ?Continue to monitor serum creatinine. ?  ?Atrial fibrillation with RVR: ?Went into A-fib with RVR requiring IV Cardizem. ?Cardiology is consulted.  Heart rate is now controlled.  Transitioned to p.o. metoprolol. ?Hold anticoagulation due to hemoptysis.  Cardiology signed off. ?Eliquis resumed.  Metoprolol increased to 50 mg twice daily for heart rate control. ?  ?Hypothyroidism: ?Continue Synthroid ?  ?BPH: ?Continue Flomax/Proscar ?  ?Possible UTI: ?Continue Rocephin.  Urine  culture grew  E. coli, sensitive to cefadroxil ?Discharged on cefadroxil 500 twice daily for 7 days. ?  ?Elevated troponins :  ?History of CAD ?Patient denies any chest pain, likely demand ischemia.  ?Less likely ACS , troponin level 47, 57.   ?Hold aspirin due to possible hemoptysis. ?Continue Lipitor. ?  ?Chronic diastolic CHF: ?2D echo on 08/09/2021 showed EF 60 to 65% with grade 3 diastolic dysfunction.   ?BNP is slightly elevated at 419, No edema,  does not seem to have CHF exacerbation. ?Restart low-dose oral Lasix 20 mg daily. ?  ? ? ?Discharge Instructions ? ?Discharge Instructions   ? ? Call MD for:  difficulty breathing, headache or visual disturbances   Complete by: As directed ?  ? Call MD for:  persistant dizziness or light-headedness   Complete by: As directed ?  ? Call MD for:  persistant nausea and vomiting   Complete by: As directed ?  ? Diet - low sodium heart healthy   Complete by: As directed ?  ? Diet Carb Modified   Complete by: As directed ?  ? Discharge instructions   Complete by: As directed ?  ? Advised to follow-up with primary care physician in 1 week. ?Advised to follow-up with urology as scheduled. ?Advised to take metoprolol 50 mg twice daily and Eliquis for atrial fibrillation.  ? Increase activity slowly   Complete by: As directed ?  ? ?  ? ?Allergies as of 08/30/2021   ?No Known Allergies ?  ? ?  ?Medication List  ?  ? ?TAKE these medications   ? ?apixaban 2.5 MG Tabs tablet ?Commonly known as: ELIQUIS ?Take 1 tablet (2.5 mg total) by mouth 2 (two) times daily. ?  ?aspirin 81 MG chewable tablet ?Chew 1 tablet (81 mg total) by mouth daily. ?  ?atorvastatin 40 MG tablet ?Commonly known as: LIPITOR ?Take 1 tablet (40 mg total) by mouth every evening. ?  ?cefadroxil 500 MG capsule ?Commonly known as: DURICEF ?Take 1 capsule (500 mg total) by mouth 2 (two) times daily for 7 days. ?  ?dextromethorphan-guaiFENesin 30-600 MG 12hr tablet ?Commonly known as: [place] DM ?Take 1 tablet by mouth 2  (two) times daily as needed for cough. ?  ?finasteride 5 MG tablet ?Commonly known as: PROSCAR ?Take 5 mg by mouth daily. ?  ?furosemide 20 MG tablet ?Commonly known as: LASIX ?Take 1 tablet (20 mg total) by mouth daily. ?Start taking on: August 31, 2021 ?  ?levothyroxine 50 MCG tablet ?Commonly known as: SYNTHROID ?Take 50 mcg by mouth daily. ?  ?metoprolol tartrate 25 MG tablet ?Commonly known as: LOPRESSOR ?Take 2 tablets (50 mg total) by mouth 2 (two) times daily. ?  ?tamsulosin 0.4 MG Caps capsule ?Commonly known as: FLOMAX ?Take 0.4 mg by mouth daily. ?  ?zolpidem 10 MG tablet ?Commonly known as: AMBIEN ?Take 10 mg by mouth at bedtime as needed for sleep. ?  ? ?  ? ? Follow-up Information   ? ? Idelle Crouch, MD Follow up.   ?Specialty: Internal Medicine ?Contact information: ?[place] [address] ?[phone] ? ? ?  ?  ? ? Andrez Grime, MD Follow up.   ?Specialty: Cardiology ?Contact information: ?[place] [address] ?[phone] ? ? ?  ?  ? ? Idelle Crouch, MD Follow up in 1 week(s).   ?Specialty: Internal Medicine ?Contact information: ?[place] [address] ?[phone] ? ? ?  ?  ? ?  ?  ? ?  ? ?  No Known Allergies ? ?Consultations: ?Cardiology and urology ? ?Procedures/Studies: ?DG Chest 2 View ? ?Result Date: 08/27/2021 ?CLINICAL DATA:  Shortness of breath EXAM: CHEST - 2 VIEW COMPARISON:  08/08/2021 FINDINGS: Cardiomediastinal silhouette and pulmonary vasculature are within normal limits. Strandy lung base opacities most likely result of atelectasis. Lungs otherwise clear. IMPRESSION: Minimal bibasilar atelectasis. Electronically Signed   By: Miachel Roux M.D.   On: 08/27/2021 12:09  ? ?CT Angio Chest PE W and/or Wo Contrast ? ?Result Date: 08/27/2021 ?CLINICAL DATA:  Chest pain, congestion, concern for PE, high probability EXAM: CT ANGIOGRAPHY CHEST WITH CONTRAST TECHNIQUE: Multidetector CT imaging of the chest was performed  using the standard protocol during bolus administration of intravenous contrast. Multiplanar CT image reconstructions and MIPs were obtained to evaluate the vascular anatomy. RADIATION DOSE REDUCTION: This exa

## 2021-08-30 NOTE — TOC Transition Note (Signed)
Transition of Care (TOC) - CM/SW Discharge Note ? ? ?Patient Details  ?Name: CLEOPHIS PRENDES ?MRN: [id number] ?Date of Birth: 1922/07/12 ? ?Transition of Care (TOC) CM/SW Contact:  ?Alberteen Sam, LCSW ?Phone Number: ?08/30/2021, 8:49 AM ? ? ?Clinical Narrative:    ? ?Patient to discharge home today, is set up with home health PT and OT with [facility]. Corene Cornea with [facility] informed of dc today and HH orders requested from MD.  ? ?No further dc needs identified.  ? ? ?Final next level of care: [place] ?Barriers to Discharge: No Barriers Identified ? ? ?Patient Goals and CMS Choice ?Patient states their goals for this hospitalization and ongoing recovery are:: to go home ?CMS Medicare.gov Compare Post Acute Care list provided to:: Patient ?Choice offered to / list presented to : Patient ? ?Discharge Placement ?  ?           ?  ?  ?  ?Patient and family notified of of transfer: 08/30/21 ? ?Discharge Plan and Services ?  ?  ?           ?  ?  ?  ?  ?  ?HH Arranged: PT, OT ?[facility] Agency: [facility] ([place]) ?Date HH Agency Contacted: 08/30/21 ?Time [place]: [phone] ?Representative spoke with at [facility]: Corene Cornea ? ?Social Determinants of Health (SDOH) Interventions ?  ? ? ?Readmission Risk Interventions ?   ? View : No data to display.  ?  ?  ?  ? ? ? ? ? ?

## 2021-09-01 LAB — CULTURE, BLOOD (ROUTINE X 2)
Culture: NO GROWTH
Culture: NO GROWTH
Special Requests: ADEQUATE

## 2021-09-26 ENCOUNTER — Ambulatory Visit: Payer: Medicare Other | Admitting: Dermatology

## 2022-06-24 IMAGING — DX DG CHEST 1V PORT
1 series · 1 of 1 positions shown · non-contrast
Comparison: 10/19/2018

CLINICAL DATA: This morning

Atrial fibrillation central chest pain beginning
EXAM:
PORTABLE CHEST - 1 VIEW

[chest ap]
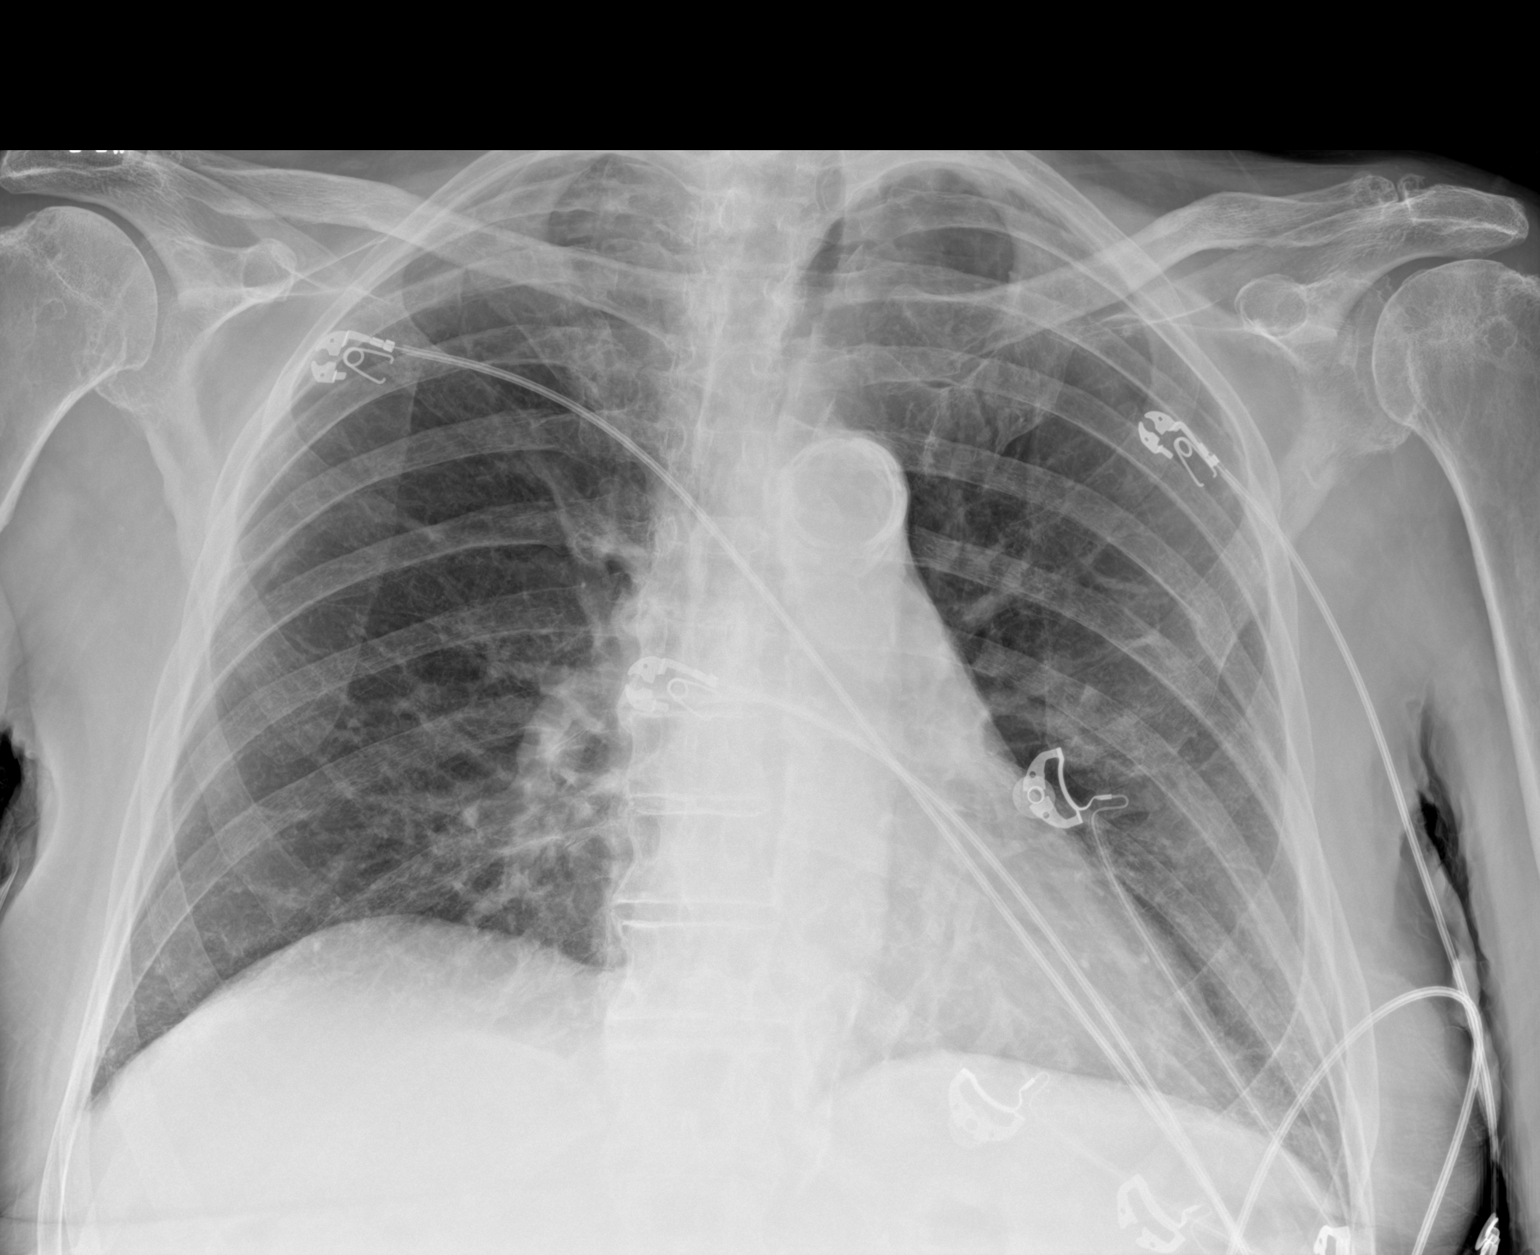

[1 of 1 positions shown; findings below may reference images not displayed]

FINDINGS: Cardiomediastinal silhouette and pulmonary vasculature are within
normal limits.

Lungs are clear.

Mild-to-moderate degenerative changes of the acromioclavicular
joint. Degenerative changes cervical spine partially visualized.
IMPRESSION: No acute cardiopulmonary process.

## 2022-07-13 ENCOUNTER — Other Ambulatory Visit: Payer: Self-pay

## 2022-07-13 ENCOUNTER — Encounter: Payer: Self-pay | Admitting: Emergency Medicine

## 2022-07-13 ENCOUNTER — Emergency Department: Payer: Medicare Other

## 2022-07-13 ENCOUNTER — Inpatient Hospital Stay
Admission: EM | Admit: 2022-07-13 | Discharge: 2022-07-16 | DRG: 871 | Disposition: A | Discharge status: Home / Self Care | Payer: Medicare Other | Attending: Internal Medicine | Admitting: Internal Medicine

## 2022-07-13 DIAGNOSIS — E119 Type 2 diabetes mellitus without complications: Secondary | ICD-10-CM

## 2022-07-13 DIAGNOSIS — E1122 Type 2 diabetes mellitus with diabetic chronic kidney disease: Secondary | ICD-10-CM | POA: Diagnosis present

## 2022-07-13 DIAGNOSIS — N401 Enlarged prostate with lower urinary tract symptoms: Secondary | ICD-10-CM | POA: Diagnosis present

## 2022-07-13 DIAGNOSIS — Z79890 Hormone replacement therapy (postmenopausal): Secondary | ICD-10-CM | POA: Diagnosis not present

## 2022-07-13 DIAGNOSIS — Z66 Do not resuscitate: Secondary | ICD-10-CM | POA: Diagnosis present

## 2022-07-13 DIAGNOSIS — I214 Non-ST elevation (NSTEMI) myocardial infarction: Principal | ICD-10-CM | POA: Diagnosis present

## 2022-07-13 DIAGNOSIS — I482 Chronic atrial fibrillation, unspecified: Secondary | ICD-10-CM | POA: Diagnosis not present

## 2022-07-13 DIAGNOSIS — Z87891 Personal history of nicotine dependence: Secondary | ICD-10-CM | POA: Diagnosis not present

## 2022-07-13 DIAGNOSIS — E785 Hyperlipidemia, unspecified: Secondary | ICD-10-CM | POA: Diagnosis present

## 2022-07-13 DIAGNOSIS — Z7982 Long term (current) use of aspirin: Secondary | ICD-10-CM

## 2022-07-13 DIAGNOSIS — I129 Hypertensive chronic kidney disease with stage 1 through stage 4 chronic kidney disease, or unspecified chronic kidney disease: Secondary | ICD-10-CM | POA: Diagnosis present

## 2022-07-13 DIAGNOSIS — A4159 Other Gram-negative sepsis: Secondary | ICD-10-CM | POA: Diagnosis present

## 2022-07-13 DIAGNOSIS — Z801 Family history of malignant neoplasm of trachea, bronchus and lung: Secondary | ICD-10-CM

## 2022-07-13 DIAGNOSIS — E1165 Type 2 diabetes mellitus with hyperglycemia: Secondary | ICD-10-CM | POA: Diagnosis present

## 2022-07-13 DIAGNOSIS — Z79899 Other long term (current) drug therapy: Secondary | ICD-10-CM

## 2022-07-13 DIAGNOSIS — D631 Anemia in chronic kidney disease: Secondary | ICD-10-CM | POA: Diagnosis present

## 2022-07-13 DIAGNOSIS — R338 Other retention of urine: Secondary | ICD-10-CM | POA: Diagnosis present

## 2022-07-13 DIAGNOSIS — N390 Urinary tract infection, site not specified: Secondary | ICD-10-CM | POA: Diagnosis not present

## 2022-07-13 DIAGNOSIS — A419 Sepsis, unspecified organism: Secondary | ICD-10-CM | POA: Diagnosis present

## 2022-07-13 DIAGNOSIS — K5900 Constipation, unspecified: Secondary | ICD-10-CM | POA: Diagnosis present

## 2022-07-13 DIAGNOSIS — Z7901 Long term (current) use of anticoagulants: Secondary | ICD-10-CM | POA: Diagnosis not present

## 2022-07-13 DIAGNOSIS — Z823 Family history of stroke: Secondary | ICD-10-CM | POA: Diagnosis not present

## 2022-07-13 DIAGNOSIS — R531 Weakness: Secondary | ICD-10-CM

## 2022-07-13 DIAGNOSIS — R54 Age-related physical debility: Secondary | ICD-10-CM | POA: Diagnosis present

## 2022-07-13 DIAGNOSIS — I351 Nonrheumatic aortic (valve) insufficiency: Secondary | ICD-10-CM | POA: Diagnosis present

## 2022-07-13 DIAGNOSIS — I2510 Atherosclerotic heart disease of native coronary artery without angina pectoris: Secondary | ICD-10-CM | POA: Diagnosis present

## 2022-07-13 DIAGNOSIS — N400 Enlarged prostate without lower urinary tract symptoms: Secondary | ICD-10-CM | POA: Diagnosis present

## 2022-07-13 DIAGNOSIS — E039 Hypothyroidism, unspecified: Secondary | ICD-10-CM | POA: Diagnosis present

## 2022-07-13 DIAGNOSIS — N1832 Chronic kidney disease, stage 3b: Secondary | ICD-10-CM | POA: Diagnosis present

## 2022-07-13 DIAGNOSIS — R0789 Other chest pain: Secondary | ICD-10-CM | POA: Diagnosis present

## 2022-07-13 DIAGNOSIS — R8281 Pyuria: Secondary | ICD-10-CM | POA: Insufficient documentation

## 2022-07-13 LAB — HEPARIN LEVEL (UNFRACTIONATED): Heparin Unfractionated: >1.1 IU/mL — ABNORMAL HIGH (ref 0.30–0.70)

## 2022-07-13 LAB — URINALYSIS, ROUTINE W REFLEX MICROSCOPIC
Bilirubin Urine: NEGATIVE
Glucose, UA: 150 mg/dL — AB
Ketones, ur: NEGATIVE mg/dL
Nitrite: POSITIVE — AB
Protein, ur: NEGATIVE mg/dL
Specific Gravity, Urine: 1.01 (ref 1.005–1.030)
WBC, UA: >50 WBC/hpf (ref 0–5)
pH: 6 (ref 5.0–8.0)

## 2022-07-13 LAB — PHOSPHORUS: Phosphorus: 3.2 mg/dL (ref 2.5–4.6)

## 2022-07-13 LAB — BASIC METABOLIC PANEL
Anion gap: 8 (ref 5–15)
BUN: 29 mg/dL — ABNORMAL HIGH (ref 8–23)
CO2: 23 mmol/L (ref 22–32)
Calcium: 8.6 mg/dL — ABNORMAL LOW (ref 8.9–10.3)
Chloride: 103 mmol/L (ref 98–111)
Creatinine, Ser: 1.67 mg/dL — ABNORMAL HIGH (ref 0.61–1.24)
GFR, Estimated: 37 mL/min — ABNORMAL LOW (ref >60)
Glucose, Bld: 361 mg/dL — ABNORMAL HIGH (ref 70–99)
Potassium: 3.8 mmol/L (ref 3.5–5.1)
Sodium: 134 mmol/L — ABNORMAL LOW (ref 135–145)

## 2022-07-13 LAB — CBC
HCT: 33 % — ABNORMAL LOW (ref 39.0–52.0)
Hemoglobin: 10.2 g/dL — ABNORMAL LOW (ref 13.0–17.0)
MCH: 25.6 pg — ABNORMAL LOW (ref 26.0–34.0)
MCHC: 30.9 g/dL (ref 30.0–36.0)
MCV: 82.9 fL (ref 80.0–100.0)
Platelets: 212 10*3/uL (ref 150–400)
RBC: 3.98 MIL/uL — ABNORMAL LOW (ref 4.22–5.81)
RDW: 17.9 % — ABNORMAL HIGH (ref 11.5–15.5)
WBC: 5.5 10*3/uL (ref 4.0–10.5)
nRBC: 0 % (ref 0.0–0.2)

## 2022-07-13 LAB — PROTIME-INR
INR: 1.2 (ref 0.8–1.2)
Prothrombin Time: 14.9 seconds (ref 11.4–15.2)

## 2022-07-13 LAB — GLUCOSE, CAPILLARY
Glucose-Capillary: 236 mg/dL — ABNORMAL HIGH (ref 70–99)
Glucose-Capillary: 196 mg/dL — ABNORMAL HIGH (ref 70–99)

## 2022-07-13 LAB — TROPONIN I (HIGH SENSITIVITY)
Troponin I (High Sensitivity): 1805 ng/L (ref <18)
Troponin I (High Sensitivity): 3062 ng/L (ref <18)

## 2022-07-13 LAB — LACTIC ACID, PLASMA
Lactic Acid, Venous: 3.2 mmol/L (ref 0.5–1.9)
Lactic Acid, Venous: 2.2 mmol/L (ref 0.5–1.9)

## 2022-07-13 LAB — MAGNESIUM: Magnesium: 2.1 mg/dL (ref 1.7–2.4)

## 2022-07-13 LAB — APTT: aPTT: 33 seconds (ref 24–36)

## 2022-07-13 IMAGING — CT CT ANGIO CHEST
2 of 7 series · 17 of 46 positions shown · IV contrast (APPLIED)
Comparison: 08/27/2021 chest x-ray

CLINICAL DATA: Chest pain, congestion, concern for PE, high
probability

EXAM:
CT ANGIOGRAPHY CHEST WITH CONTRAST
TECHNIQUE: Multidetector CT imaging of the chest was performed using the
standard protocol during bolus administration of intravenous
contrast. Multiplanar CT image reconstructions and MIPs were
obtained to evaluate the vascular anatomy.

[Series 5: thins · axial · 0.76mm/px · z∈[-646,-356]mm · 15 of 328 slices shown]
[im 19/328  lung]
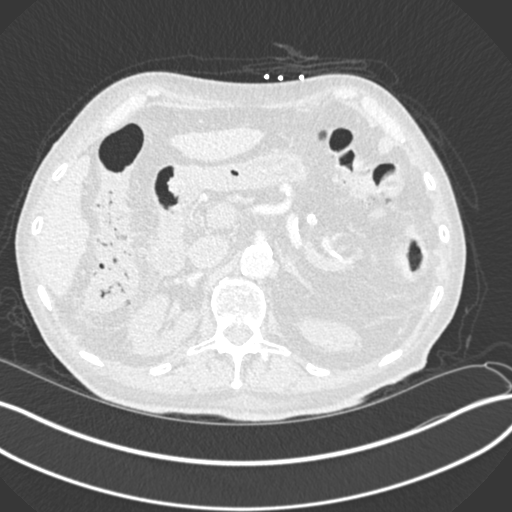
[im 37/328  soft-tissue]
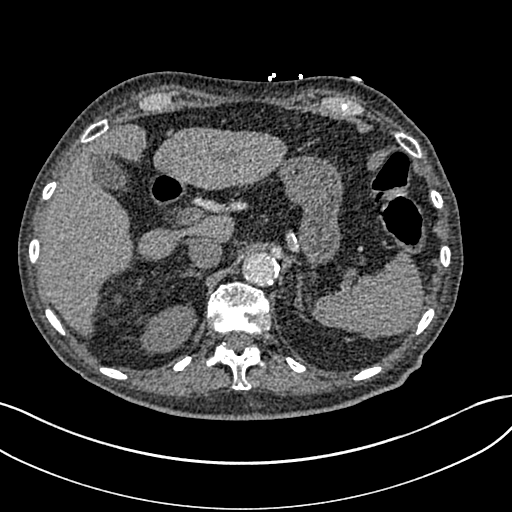
[im 55/328  lung]
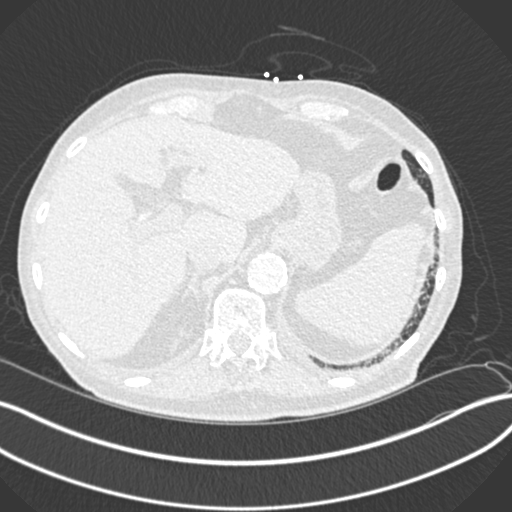
[im 73/328  soft-tissue]
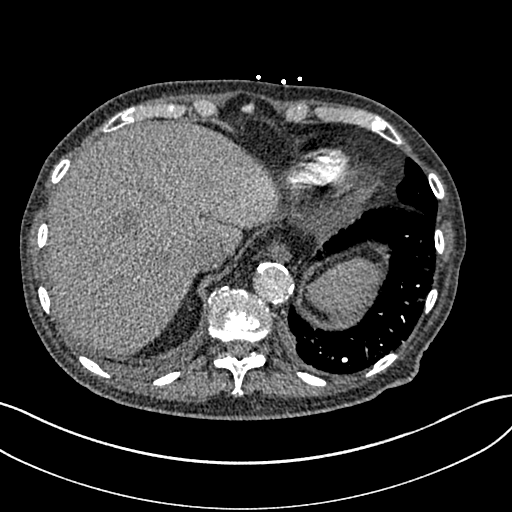
[im 110/328  lung]
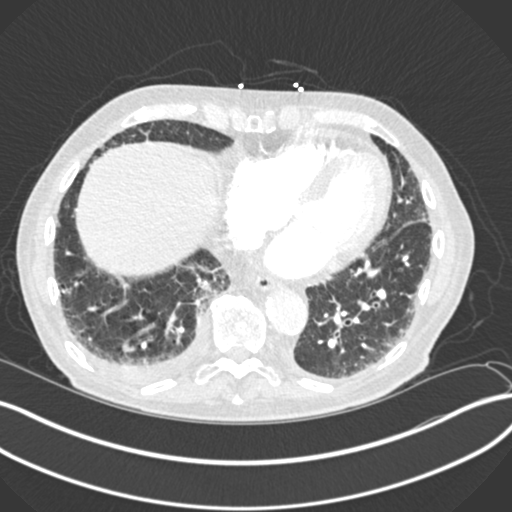
[im 128/328  soft-tissue]
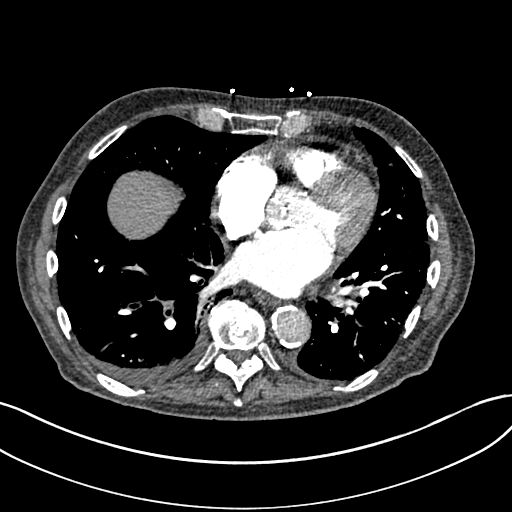
[im 146/328  lung]
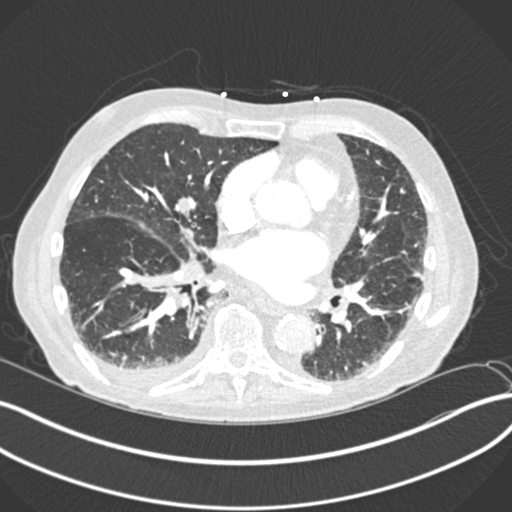
[im 164/328  soft-tissue]
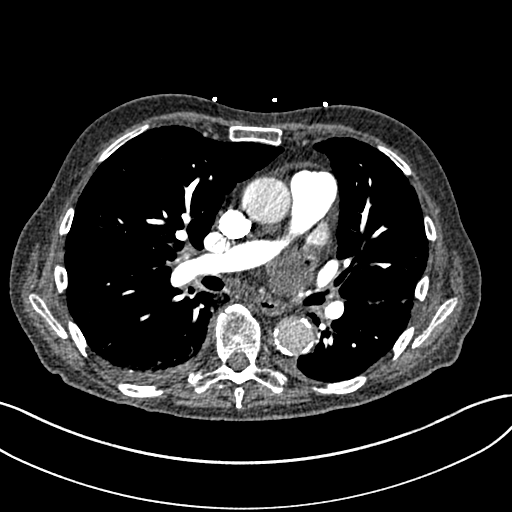
[im 182/328  lung]
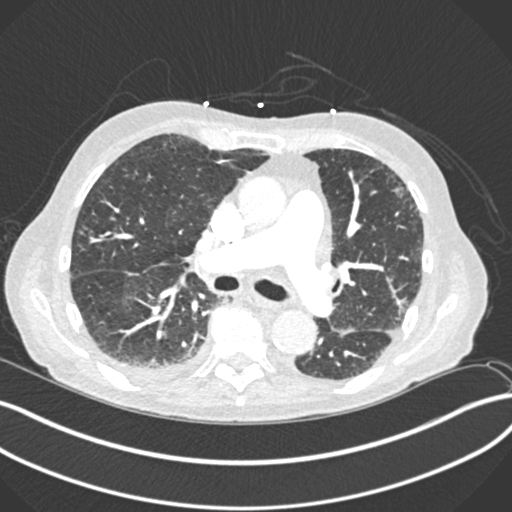
[im 200/328  soft-tissue]
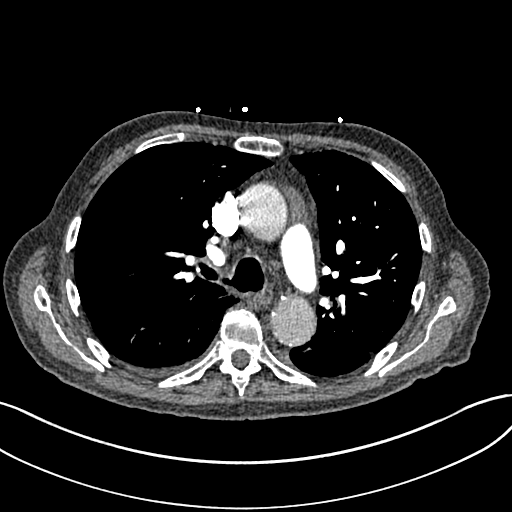
[im 219/328  lung]
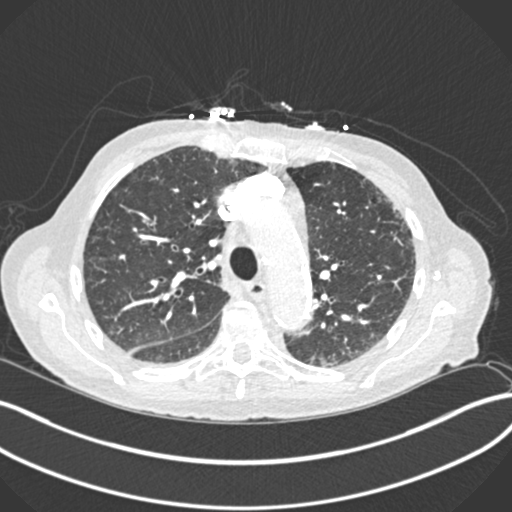
[im 255/328  soft-tissue]
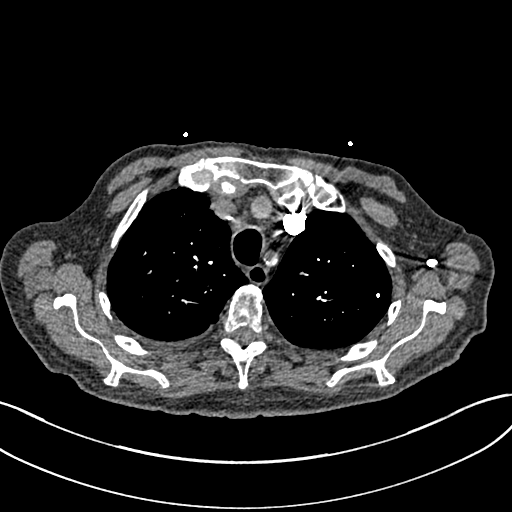
[im 273/328  lung]
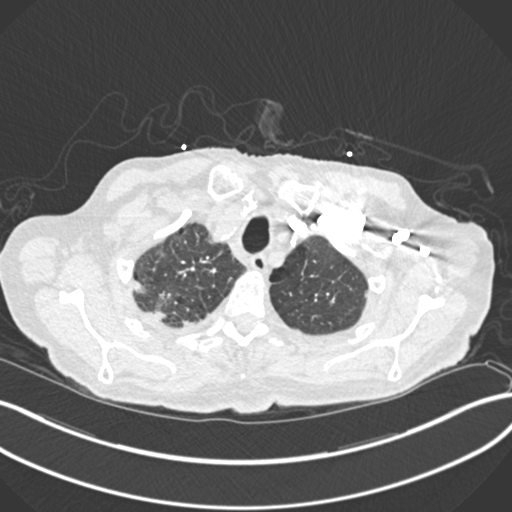
[im 291/328  soft-tissue]
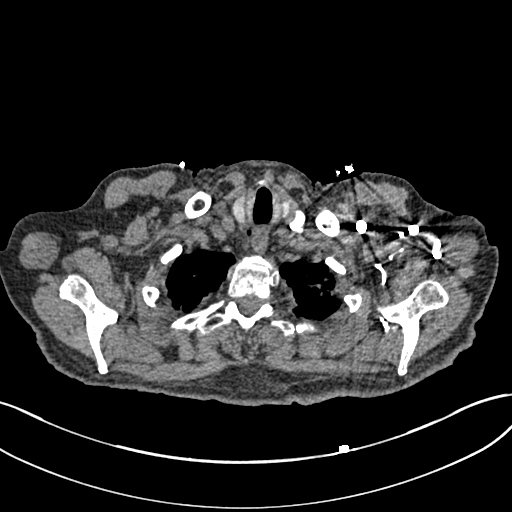
[im 309/328  lung]
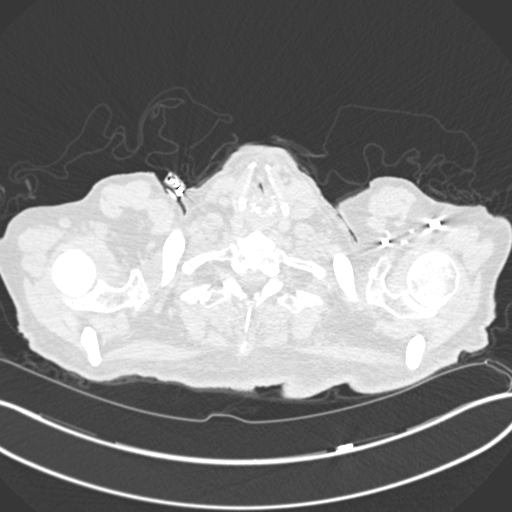

[Series 7: coronal mpr · coronal · 0.67mm/px · 2 of 92 slices shown]
[im 31/92  soft-tissue]
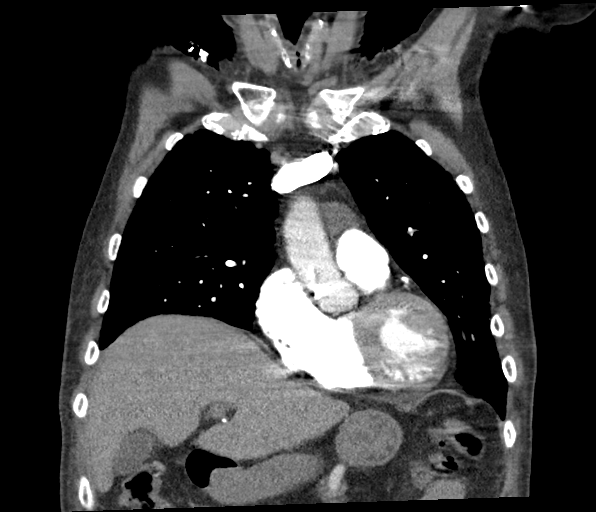
[im 61/92  soft-tissue]
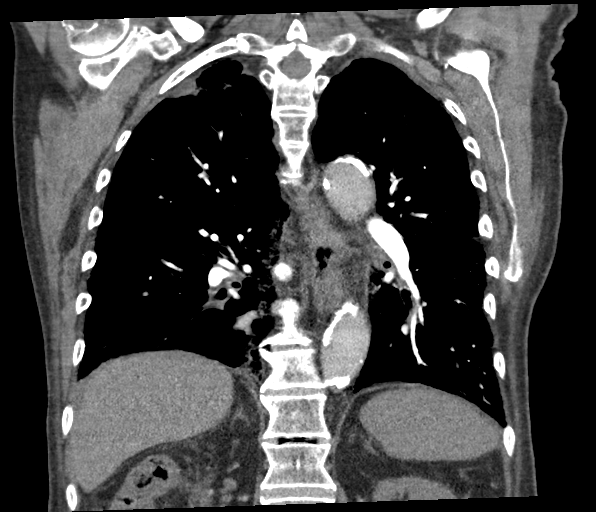

[17 of 46 positions shown; findings below may reference images not displayed]

RADIATION DOSE REDUCTION: This exam was performed according to the
departmental dose-optimization program which includes automated
exposure control, adjustment of the mA and/or kV according to
patient size and/or use of iterative reconstruction technique.

CONTRAST:  75mL OMNIPAQUE IOHEXOL 350 MG/ML SOLN
FINDINGS: Cardiovascular: Pulmonary arteries are well visualized and normal in
caliber. No significant pulmonary artery filling defect or pulmonary
embolus by CTA.

Normal heart size. Pericardial thickening noted anteriorly. No
pericardial effusion. Extensive calcific atherosclerosis of the
aorta and major branch vessels. Negative for aneurysm or dissection.
No mediastinal hemorrhage or hematoma. Major branch vessels are not
well opacified because of the phase of imaging for PE study.

Central venous structures appear patent. No [place] process.
Native coronary atherosclerosis. Aortic valve calcifications
present.

Mediastinum/Nodes: No enlarged mediastinal, hilar, or axillary lymph
nodes. Thyroid gland, trachea, and esophagus demonstrate no
significant findings.

Lungs/Pleura: Biapical pleural-parenchymal scarring and apical small
blebs more pronounced on the left. Bilateral upper lobe predominant
paraseptal emphysema and subpleural parenchymal scarring.

Right lower lobe areas of bronchial wall thickening, nonspecific for
bronchitis.

Right middle lobe peribronchovascular low-density nodule/branching
nodularity measures 9 mm, image 62/6 which has fat attenuation low
Hounsfield measurements suspicious for areas of a right middle lobe
mucoid impaction.

Additional areas of bibasilar subpleural interstitial changes/early
fibrosis. Very small pleural effusions, slightly larger on the right
layering posteriorly. No other pleural abnormality or pneumothorax.

Upper Abdomen: Extensive upper abdominal atherosclerosis. Mesenteric
and renal vasculature appear to remain patent. Gallbladder
collapsed. Small punctate gallstone noted, image 101/4 in the
fundus. Scattered colonic diverticulosis. No definite acute
inflammatory process.

Musculoskeletal: Degenerative changes throughout the spine. No chest
wall soft tissue abnormality. No acute osseous finding. Negative for
compression fracture. Sternum intact.

Review of the MIP images confirms the above findings.
IMPRESSION: Negative for significant acute pulmonary embolus by CTA.

Extensive thoracic aortic atherosclerosis without acute aortic
process, aneurysm or dissection.

Native coronary atherosclerosis and aortic valve calcifications.

Background biapical pleuroparenchymal scarring and paraseptal
emphysema.

Peribronchovascular branching fat density right middle lobe
nodularity measuring up to 9 mm favored to represent mucoid
impaction/plugging.

Chronic appearing right lower lobe diffuse bronchial wall
thickening/bronchitis.

Chronic basilar interstitial changes and trace pleural effusions.

Aortic Atherosclerosis (DK3DC-R62.2) and Emphysema (DK3DC-DPJ.J).

## 2022-07-13 IMAGING — US US EXTREM LOW VENOUS
1 series · 14 of 24 positions shown · non-contrast
Comparison: None.

CLINICAL DATA: Shortness of breath, edema

EXAM:
BILATERAL LOWER EXTREMITY VENOUS DOPPLER ULTRASOUND
TECHNIQUE: Gray-scale sonography with compression, as well as color and duplex
ultrasound, were performed to evaluate the deep venous system(s)
from the level of the common femoral vein through the popliteal and
proximal calf veins.

[Series 1: us venous img lower bilat (dvt) · portal-venous · 14 of 63 slices shown]
[im 1/63]
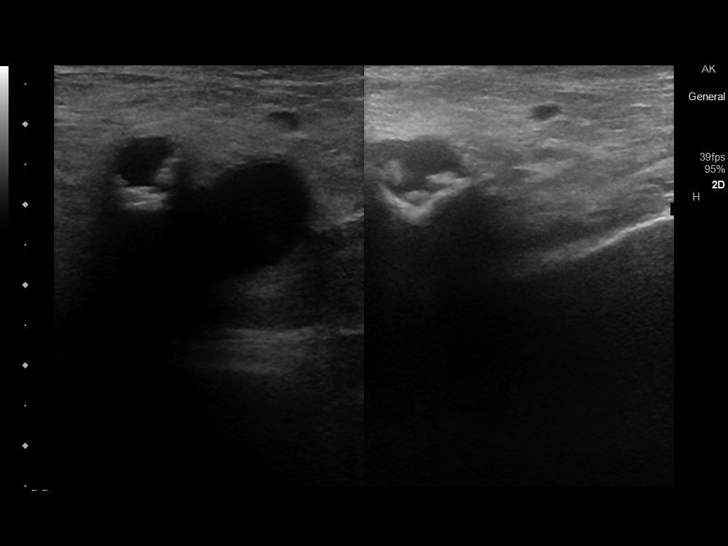
[im 6/63]
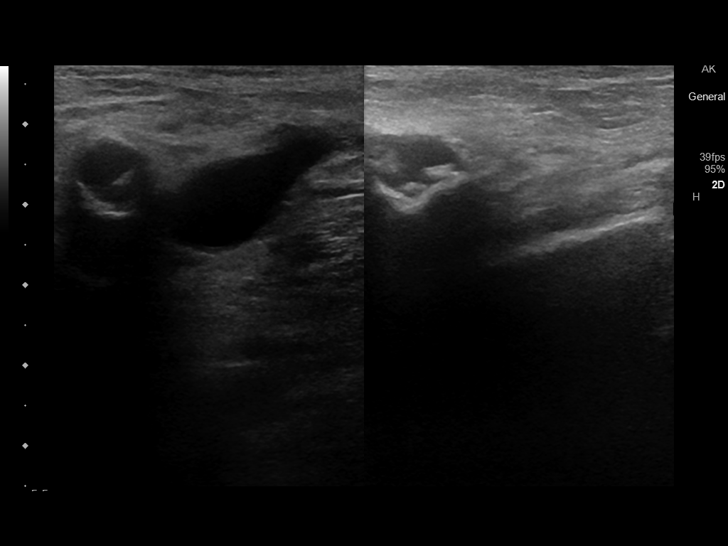
[im 11/63]
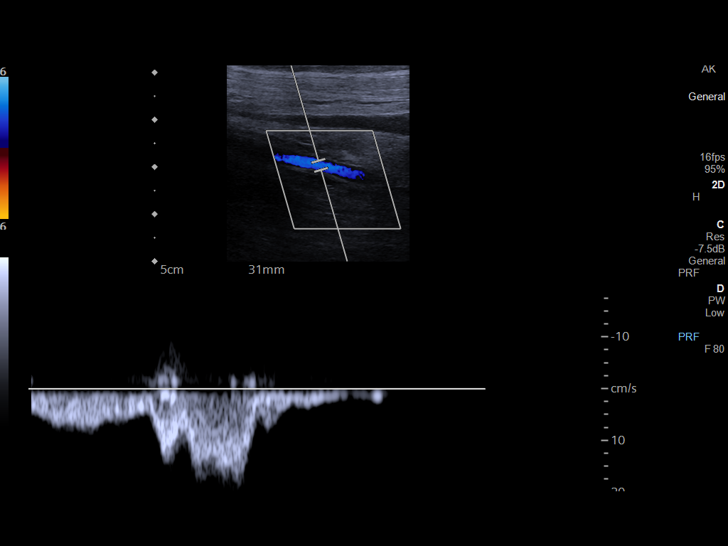
[im 17/63]
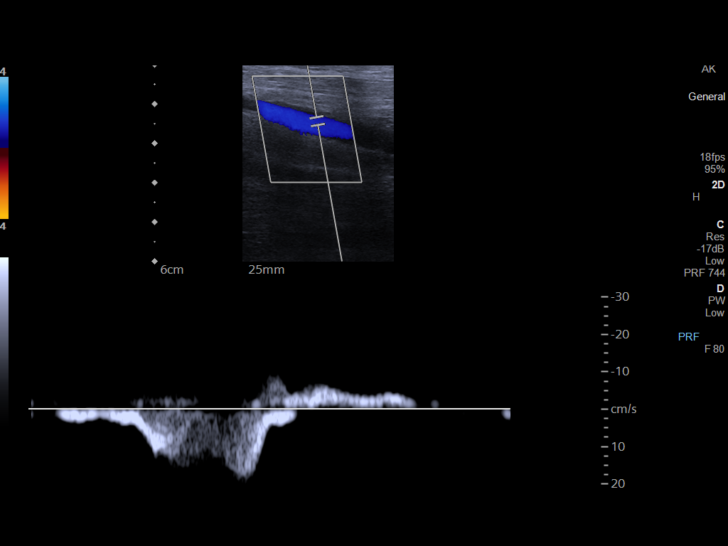
[im 19/63]
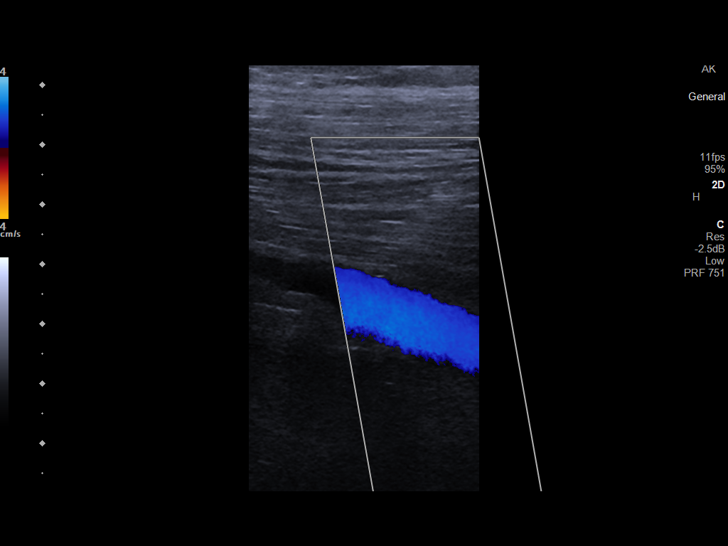
[im 25/63]
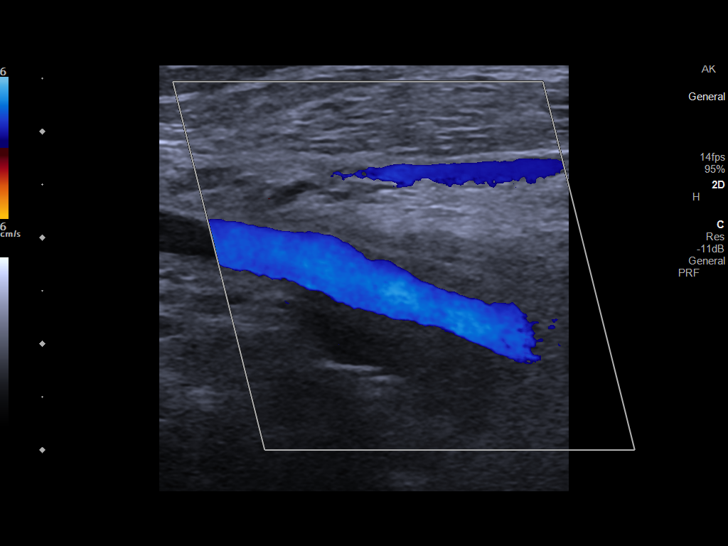
[im 30/63]
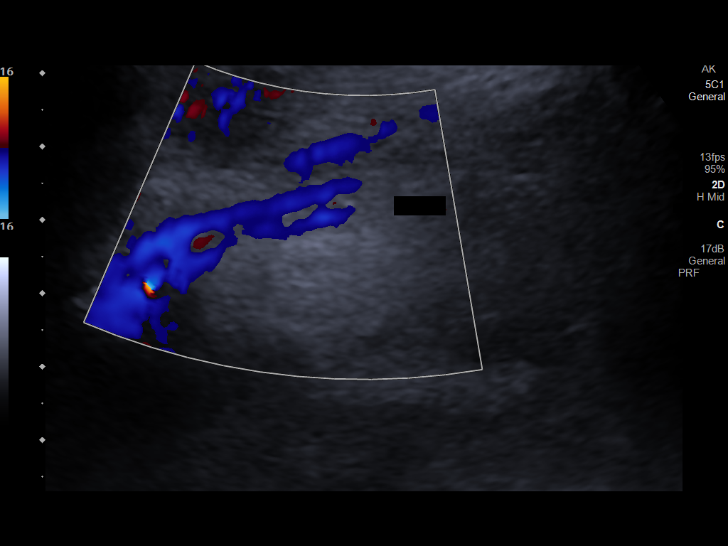
[im 33/63]
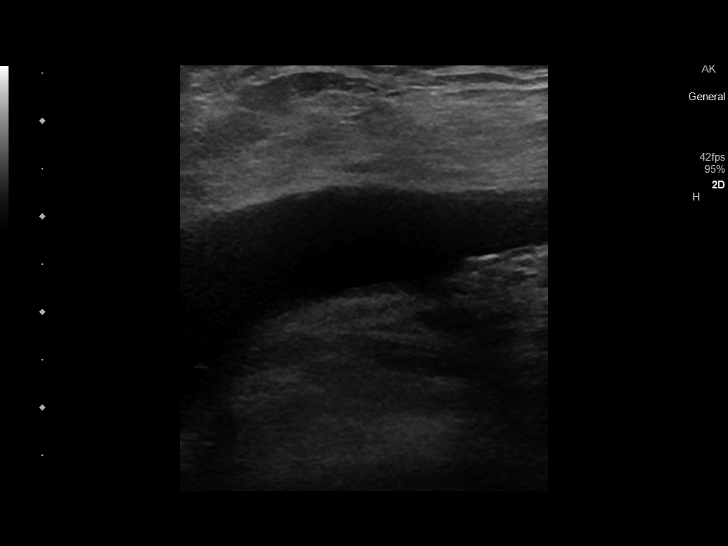
[im 38/63]
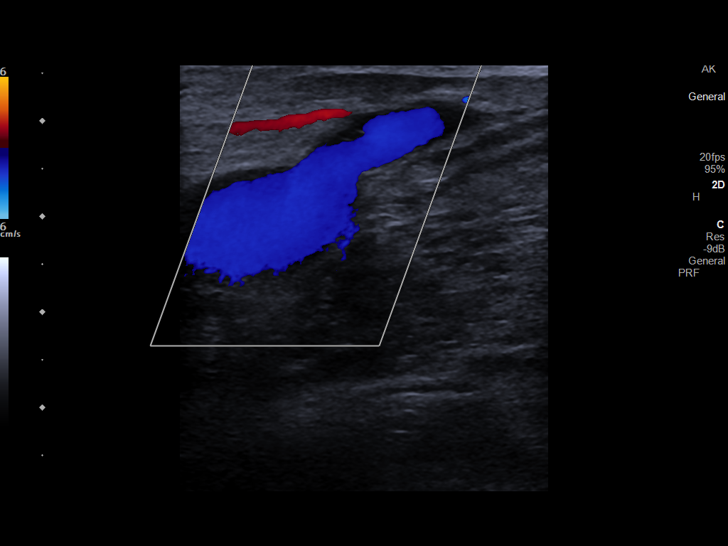
[im 44/63]
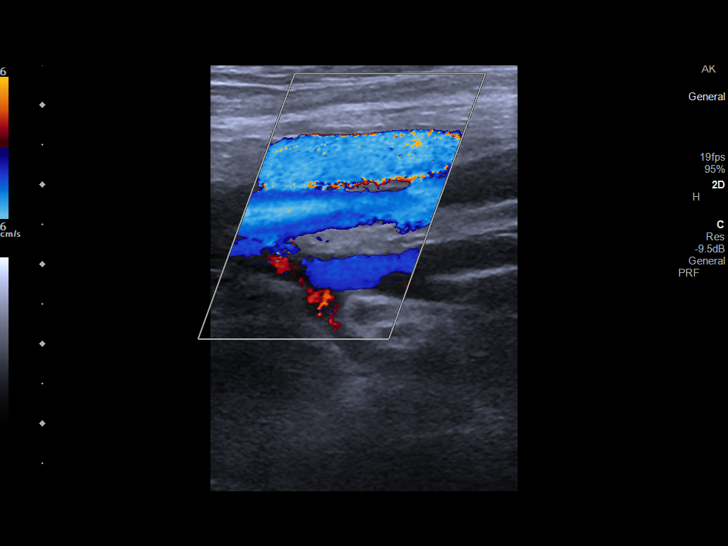
[im 49/63]
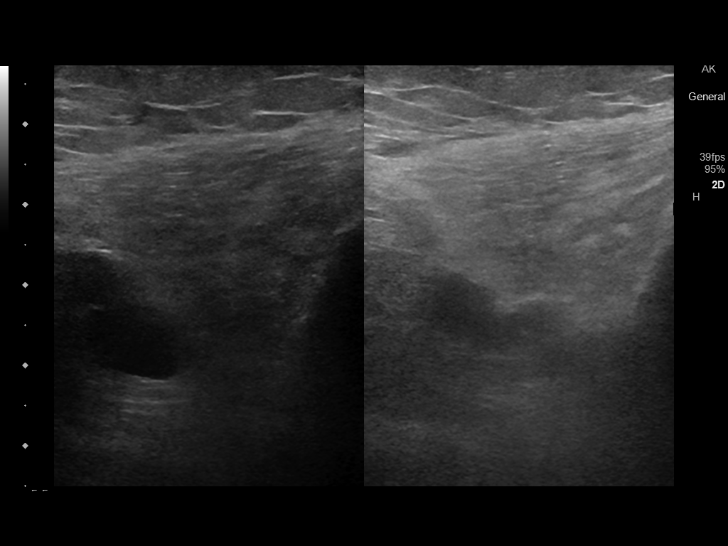
[im 52/63]
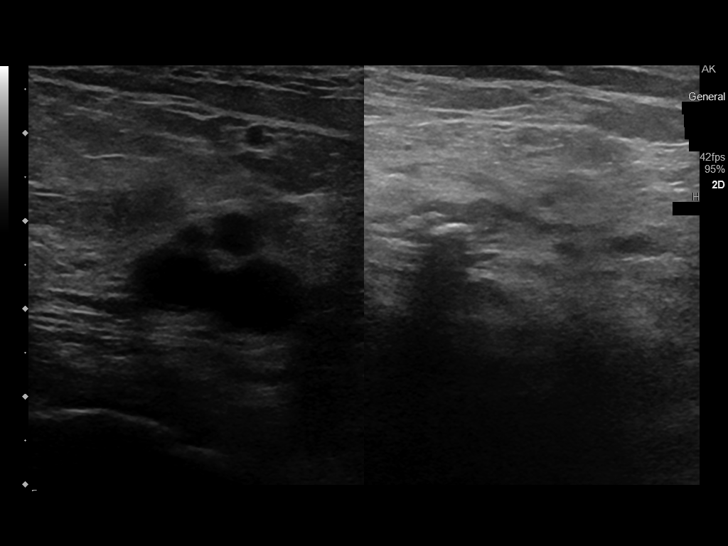
[im 57/63]
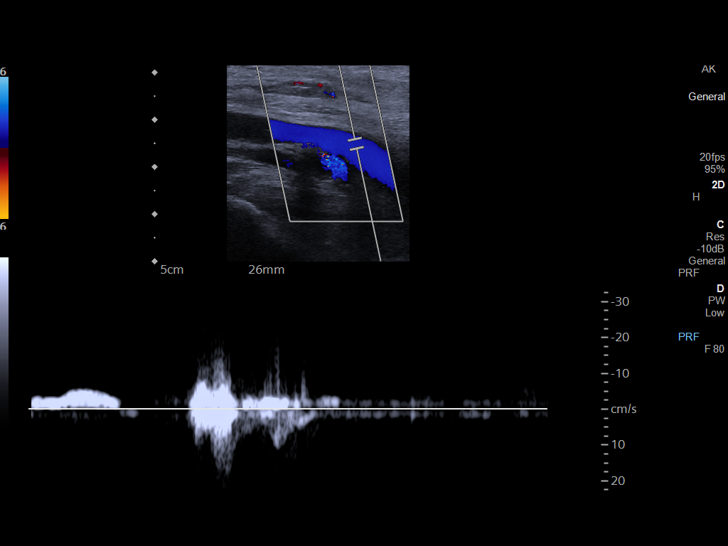
[im 63/63]
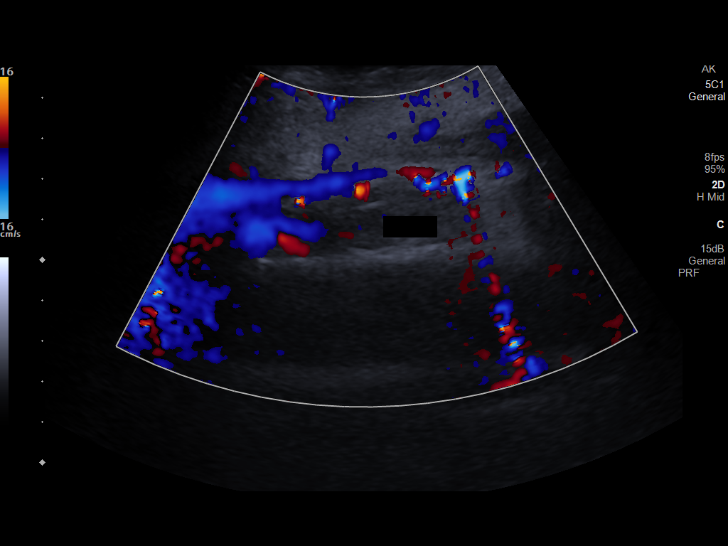

[14 of 24 positions shown; findings below may reference images not displayed]

FINDINGS: VENOUS

Normal compressibility of the common femoral, superficial femoral,
and popliteal veins, as well as the visualized calf veins. Bilateral
peroneal veins were not visualized. Visualized portions of profunda
femoral vein and great saphenous vein unremarkable. No filling
defects to suggest DVT on grayscale or color Doppler imaging.
Doppler waveforms show normal direction of venous flow, normal
respiratory plasticity and response to augmentation.

OTHER

None.

Limitations: none
IMPRESSION: Negative.

## 2022-07-13 MED ORDER — METRONIDAZOLE 500 MG/100ML IV SOLN
500.0000 mg | Freq: Two times a day (BID) | INTRAVENOUS | Status: DC
  Administered 2022-07-13 – 2022-07-14 (×2): 500 mg via INTRAVENOUS
  Filled 2022-07-13 (×3): qty 100

## 2022-07-13 MED ORDER — INSULIN ASPART 100 UNIT/ML IJ SOLN
0.0000 [IU] | Freq: Three times a day (TID) | INTRAMUSCULAR | Status: DC
  Administered 2022-07-13: 3 [IU] via SUBCUTANEOUS
  Administered 2022-07-14: 1 [IU] via SUBCUTANEOUS
  Administered 2022-07-14: 3 [IU] via SUBCUTANEOUS
  Administered 2022-07-14: 2 [IU] via SUBCUTANEOUS
  Administered 2022-07-15: 5 [IU] via SUBCUTANEOUS
  Administered 2022-07-15 (×2): 1 [IU] via SUBCUTANEOUS
  Administered 2022-07-16: 3 [IU] via SUBCUTANEOUS
  Administered 2022-07-16: 1 [IU] via SUBCUTANEOUS
  Filled 2022-07-13 (×7): qty 1

## 2022-07-13 MED ORDER — ACETAMINOPHEN 650 MG RE SUPP: 650.0000 mg | Freq: Four times a day (QID) | RECTAL | Status: DC | PRN

## 2022-07-13 MED ORDER — NITROGLYCERIN 2 % TD OINT
0.5000 [in_us] | TOPICAL_OINTMENT | Freq: Four times a day (QID) | TRANSDERMAL | Status: DC
  Administered 2022-07-13 – 2022-07-15 (×7): 0.5 [in_us] via TOPICAL
  Filled 2022-07-13 (×7): qty 1

## 2022-07-13 MED ORDER — GUAIFENESIN ER 600 MG PO TB12: 600.0000 mg | ORAL_TABLET | Freq: Two times a day (BID) | ORAL | Status: DC | PRN

## 2022-07-13 MED ORDER — VANCOMYCIN HCL 1250 MG/250ML IV SOLN
1250.0000 mg | INTRAVENOUS | Status: DC
  Administered 2022-07-13: 1250 mg via INTRAVENOUS
  Filled 2022-07-13 (×2): qty 250

## 2022-07-13 MED ORDER — METOPROLOL TARTRATE 50 MG PO TABS
50.0000 mg | ORAL_TABLET | Freq: Two times a day (BID) | ORAL | Status: DC
  Administered 2022-07-13 – 2022-07-14 (×3): 50 mg via ORAL
  Filled 2022-07-13 (×3): qty 1

## 2022-07-13 MED ORDER — SODIUM CHLORIDE 0.9 % IV SOLN
2.0000 g | INTRAVENOUS | Status: DC
  Administered 2022-07-13: 2 g via INTRAVENOUS
  Filled 2022-07-13: qty 12.5
  Filled 2022-07-13: qty 2

## 2022-07-13 MED ORDER — ATORVASTATIN CALCIUM 20 MG PO TABS: 40.0000 mg | ORAL_TABLET | Freq: Every day | ORAL | Status: DC

## 2022-07-13 MED ORDER — SENNOSIDES-DOCUSATE SODIUM 8.6-50 MG PO TABS
1.0000 | ORAL_TABLET | Freq: Every evening | ORAL | Status: DC | PRN
  Administered 2022-07-15: 1 via ORAL
  Filled 2022-07-13: qty 1

## 2022-07-13 MED ORDER — ASPIRIN 81 MG PO CHEW
81.0000 mg | CHEWABLE_TABLET | Freq: Every day | ORAL | Status: DC
  Administered 2022-07-14 – 2022-07-16 (×3): 81 mg via ORAL
  Filled 2022-07-13 (×3): qty 1

## 2022-07-13 MED ORDER — FINASTERIDE 5 MG PO TABS
5.0000 mg | ORAL_TABLET | Freq: Every day | ORAL | Status: DC
  Administered 2022-07-13 – 2022-07-16 (×4): 5 mg via ORAL
  Filled 2022-07-13 (×5): qty 1

## 2022-07-13 MED ORDER — ONDANSETRON HCL 4 MG/2ML IJ SOLN: 4.0000 mg | Freq: Four times a day (QID) | INTRAMUSCULAR | Status: DC | PRN

## 2022-07-13 MED ORDER — HEPARIN SODIUM (PORCINE) 5000 UNIT/ML IJ SOLN: 5000.0000 [IU] | Freq: Three times a day (TID) | INTRAMUSCULAR | Status: DC

## 2022-07-13 MED ORDER — BENZONATATE 100 MG PO CAPS: 100.0000 mg | ORAL_CAPSULE | Freq: Two times a day (BID) | ORAL | Status: DC | PRN

## 2022-07-13 MED ORDER — MORPHINE SULFATE (PF) 2 MG/ML IV SOLN
2.0000 mg | INTRAVENOUS | Status: DC | PRN
  Administered 2022-07-13: 2 mg via INTRAVENOUS
  Filled 2022-07-13: qty 1

## 2022-07-13 MED ORDER — HEPARIN (PORCINE) 25000 UT/250ML-% IV SOLN
850.0000 [IU]/h | INTRAVENOUS | Status: AC
  Administered 2022-07-13 – 2022-07-14 (×2): 850 [IU]/h via INTRAVENOUS
  Filled 2022-07-13 (×2): qty 250

## 2022-07-13 MED ORDER — ACETAMINOPHEN 325 MG PO TABS
650.0000 mg | ORAL_TABLET | Freq: Four times a day (QID) | ORAL | Status: DC | PRN
  Administered 2022-07-13: 650 mg via ORAL
  Filled 2022-07-13: qty 2

## 2022-07-13 MED ORDER — INSULIN ASPART 100 UNIT/ML IJ SOLN: 6.0000 [IU] | Freq: Once | INTRAMUSCULAR | Status: DC

## 2022-07-13 MED ORDER — ATORVASTATIN CALCIUM 20 MG PO TABS
40.0000 mg | ORAL_TABLET | Freq: Every evening | ORAL | Status: DC
  Administered 2022-07-13 – 2022-07-15 (×3): 40 mg via ORAL
  Filled 2022-07-13 (×3): qty 2

## 2022-07-13 MED ORDER — ONDANSETRON HCL 4 MG PO TABS: 4.0000 mg | ORAL_TABLET | Freq: Four times a day (QID) | ORAL | Status: DC | PRN

## 2022-07-13 MED ORDER — INSULIN ASPART 100 UNIT/ML IJ SOLN
0.0000 [IU] | Freq: Every day | INTRAMUSCULAR | Status: DC
  Administered 2022-07-14: 2 [IU] via SUBCUTANEOUS
  Filled 2022-07-13 (×2): qty 1

## 2022-07-13 MED ORDER — CHLORHEXIDINE GLUCONATE CLOTH 2 % EX PADS
6.0000 | MEDICATED_PAD | Freq: Every day | CUTANEOUS | Status: DC
  Administered 2022-07-14 – 2022-07-16 (×3): 6 via TOPICAL

## 2022-07-13 MED ORDER — NITROGLYCERIN 0.4 MG SL SUBL
0.4000 mg | SUBLINGUAL_TABLET | SUBLINGUAL | Status: DC | PRN
  Administered 2022-07-13 (×2): 0.4 mg via SUBLINGUAL
  Filled 2022-07-13: qty 1

## 2022-07-13 MED ORDER — SODIUM CHLORIDE 0.9 % IV SOLN: 1.0000 g | INTRAVENOUS | Status: DC

## 2022-07-13 MED ORDER — SODIUM CHLORIDE 0.9 % IV SOLN
2.0000 g | INTRAVENOUS | Status: DC
  Administered 2022-07-13: 2 g via INTRAVENOUS
  Filled 2022-07-13 (×2): qty 20

## 2022-07-13 MED ORDER — ZOLPIDEM TARTRATE 5 MG PO TABS
5.0000 mg | ORAL_TABLET | Freq: Every evening | ORAL | Status: DC | PRN
  Administered 2022-07-13 – 2022-07-15 (×3): 5 mg via ORAL
  Filled 2022-07-13 (×3): qty 1

## 2022-07-13 MED ORDER — LEVOTHYROXINE SODIUM 50 MCG PO TABS
50.0000 ug | ORAL_TABLET | Freq: Every day | ORAL | Status: DC
  Administered 2022-07-14 – 2022-07-16 (×3): 50 ug via ORAL
  Filled 2022-07-13 (×3): qty 1

## 2022-07-13 NOTE — Assessment & Plan Note (Addendum)
Present on admission - Ceftriaxone 2 g IV daily, 5-day course ordered - Urine cultures in process

## 2022-07-13 NOTE — Assessment & Plan Note (Addendum)
Chronic urinary retention - Patient does self cath at home  - Foley placed on admission at family's request for convenience during admission --D/C Foley this afternoon, resume in/out cath -- Admitted with UTI on empiric IV antibiotic -- UTI likely due to retention -- Follow up with Urology

## 2022-07-13 NOTE — Consult Note (Addendum)
Pharmacy Antibiotic Note  Joshua Rogers is a 89 y.o. male admitted on 07/13/2022 with sepsis.  Pharmacy has been consulted for vancomycin and cefepime dosing.  Plan: Vancomycin '1250mg'$  IV every 48 hours Cefepime 2g IV every 24 hours Goal AUC 400-550  Est AUC: 475.9 Est Cmax: 34.2 Est Cmin: 10.6 Calculated with SCr 1.67 mg/dL    Height: 5' 9.5" (176.5 cm) Weight: 72.3 kg (159 lb 4.8 oz) IBW/kg (Calculated) : 71.85  Temp (24hrs), Avg:97.5 F (36.4 C), Min:97.3 F (36.3 C), Max:97.6 F (36.4 C)  Recent Labs  Lab 07/13/22 1028 07/13/22 1532  WBC 5.5  --   CREATININE 1.67*  --   LATICACIDVEN  --  3.2*    Estimated Creatinine Clearance: 25.1 mL/min (A) (by C-G formula based on SCr of 1.67 mg/dL (H)).    No Known Allergies  Antimicrobials this admission: 2/25 vancomycin >>  2/25 cefepime >>   Microbiology results: 2/25 BCx: sent 2/25 UCx: sent   Thank you for allowing pharmacy to be a part of this patient's care.  Darrick Penna 07/13/2022 5:04 PM

## 2022-07-13 NOTE — Assessment & Plan Note (Addendum)
Presented with chest pain, hs-troponin 1805 >> 3062. --Mgmt per Cardiology --Treated with 48 hrs IV heparin  --No plan for invasive procedures, conservative medical mgmt recommended --Nitropaste started --PRN SL nitro, IV morphine --Continue metoprolol, Lipitor --Follow pending Echo report

## 2022-07-13 NOTE — Assessment & Plan Note (Addendum)
With hyperglycemia, glucose 361 on admission --Sliding scale Novolog --Adjust for goal 140-180

## 2022-07-13 NOTE — Assessment & Plan Note (Signed)
Patient took his Eliquis on 07/13/2022 in the morning - We will hold this while he is on heparin gtt.

## 2022-07-13 NOTE — ED Provider Notes (Signed)
[facility] Provider Note    Event Date/Time   First MD Initiated Contact with Patient 07/13/22 1043     (approximate)   History   No chief complaint on file.   HPI  Joshua Rogers is a 89 y.o. male past medical history significant for atrial fibrillation on Eliquis who presents to the emergency department with chest pain.  Endorses chest pain that has been ongoing for the past 2 days.  Started with left arm pain and then radiated to his chest into his right arm.  Now is having pain to his left arm and chest.  Denies any shortness of breath or cough.  No recent falls or trauma.  No history of DVT or PE.  Last took his Eliquis this morning.  Took 324 mg of aspirin this morning.  Mild left-sided chest pain at this time.  Describes it as a discomfort.  Also complaining of some left lower abdominal discomfort and some burning at the end of catheterization.  Does in and out catheterizations.     Physical Exam   Triage Vital Signs: ED Triage Vitals  Enc Vitals Group     BP 07/13/22 1026 122/69     Pulse Rate 07/13/22 1026 85     Resp 07/13/22 1026 18     Temp 07/13/22 1026 (!) 97.3 F (36.3 C)     Temp Source 07/13/22 1026 Oral     SpO2 07/13/22 1026 97 %     Weight 07/13/22 1025 155 lb (70.3 kg)     Height 07/13/22 1025 5' 9.5" (1.765 m)     Head Circumference --      Peak Flow --      Pain Score --      Pain Loc --      Pain Edu? --      Excl. in [place]? --     Most recent vital signs: Vitals:   07/13/22 1100 07/13/22 1200  BP: (!) 148/68 (!) 155/82  Pulse: 80 82  Resp: 15 (!) 22  Temp:    SpO2: 100% 100%    Physical Exam Constitutional:      Appearance: He is well-developed.  HENT:     Head: Atraumatic.  Eyes:     Conjunctiva/sclera: Conjunctivae normal.  Cardiovascular:     Rate and Rhythm: Tachycardia present. Rhythm irregular.  Pulmonary:     Effort: No respiratory distress.  Musculoskeletal:     Cervical back: Normal range of  motion.     Right lower leg: No edema.     Left lower leg: No edema.  Skin:    General: Skin is warm.     Capillary Refill: Capillary refill takes less than 2 seconds.  Neurological:     Mental Status: He is alert. Mental status is at baseline.     IMPRESSION / MDM / ASSESSMENT AND PLAN / ED COURSE  I reviewed the triage vital signs and the nursing notes.  On chart review patient is followed by Dr. Clayborn Bigness for his atrial fibrillation and does take Eliquis.  Differential diagnosis including ACS, pneumonia, heart failure exacerbation, urinary tract infection  EKG  I, Nathaniel Man, the attending physician, personally viewed and interpreted this ECG.   Rate: Normal  Rhythm: Atrial fibrillation  Axis: Normal  Intervals: Normal  ST&T Change: Nonspecific ST changes No significant change when compared to prior EKG  atrial fibrillation while on cardiac telemetry.  Repeat EKG with no signs of a STEMI.  RADIOLOGY I independently reviewed imaging, my interpretation of imaging: Chest x-ray with no focal findings consistent with pneumonia no signs of pulmonary edema  LABS (all labs ordered are listed, but only abnormal results are displayed) Labs interpreted as -    Labs Reviewed  BASIC METABOLIC PANEL - Abnormal; Notable for the following components:      Result Value   Sodium 134 (*)    Glucose, Bld 361 (*)    BUN 29 (*)    Creatinine, Ser 1.67 (*)    Calcium 8.6 (*)    GFR, Estimated 37 (*)    All other components within normal limits  CBC - Abnormal; Notable for the following components:   RBC 3.98 (*)    Hemoglobin 10.2 (*)    HCT 33.0 (*)    MCH 25.6 (*)    RDW 17.9 (*)    All other components within normal limits  URINALYSIS, ROUTINE W REFLEX MICROSCOPIC - Abnormal; Notable for the following components:   Color, Urine YELLOW (*)    APPearance CLOUDY (*)    Glucose, UA 150 (*)    Hgb urine dipstick SMALL (*)    Nitrite POSITIVE (*)    Leukocytes,Ua LARGE (*)     Bacteria, UA RARE (*)    All other components within normal limits  TROPONIN I (HIGH SENSITIVITY) - Abnormal; Notable for the following components:   Troponin I (High Sensitivity) 1,805 (*)    All other components within normal limits  CULTURE, BLOOD (ROUTINE X 2)  CULTURE, BLOOD (ROUTINE X 2)  URINE CULTURE  MAGNESIUM  PHOSPHORUS  LACTIC ACID, PLASMA  LACTIC ACID, PLASMA  TROPONIN I (HIGH SENSITIVITY)    TREATMENT  Nitroglycerin  MDM    Patient with significantly elevated troponin of 1800.  Clinical picture is concerning for NSTEMI.  Consulted and discussed the patient's case with Dr. Saralyn Pilar who recommended admission to the hospitalist service.  Given that the patient last took his Eliquis this morning, will hold on heparin.,  Patient already received aspirin.  Repeat EKG without findings of a STEMI.  Urine came back and appears infected.  Patient otherwise does not meet criteria for sepsis.  Will add on blood cultures and a lactic acid.  Will start on IV antibiotics.  Dr. Tobie Poet will order antibiotics and blood cultures.  On chart review pansensitive urine cultures in the past.   PROCEDURES:  Critical Care performed: Yes  .Critical Care  Performed by: Nathaniel Man, MD Authorized by: Nathaniel Man, MD   Critical care provider statement:    Critical care time (minutes):  40   Critical care time was exclusive of:  Separately billable procedures and treating other patients   Critical care was necessary to treat or prevent imminent or life-threatening deterioration of the following conditions:  Cardiac failure   Critical care was time spent personally by me on the following activities:  Development of treatment plan with patient or surrogate, discussions with consultants, evaluation of patient's response to treatment, examination of patient, ordering and review of laboratory studies, ordering and review of radiographic studies, ordering and performing treatments and  interventions, pulse oximetry, re-evaluation of patient's condition and review of old charts   Patient's presentation is most consistent with acute presentation with potential threat to life or bodily function.   MEDICATIONS ORDERED IN ED: Medications  nitroGLYCERIN (NITROSTAT) SL tablet 0.4 mg (0.4 mg Sublingual Given 07/13/22 1244)  acetaminophen (TYLENOL) tablet 650 mg (has no administration in time range)  Or  acetaminophen (TYLENOL) suppository 650 mg (has no administration in time range)  ondansetron (ZOFRAN) tablet 4 mg (has no administration in time range)    Or  ondansetron (ZOFRAN) injection 4 mg (has no administration in time range)  heparin injection 5,000 Units (has no administration in time range)  senna-docusate (Senokot-S) tablet 1 tablet (has no administration in time range)  cefTRIAXone (ROCEPHIN) 2 g in sodium chloride 0.9 % 100 mL IVPB (has no administration in time range)  morphine (PF) 2 MG/ML injection 2 mg (has no administration in time range)    FINAL CLINICAL IMPRESSION(S) / ED DIAGNOSES   Final diagnoses:  NSTEMI (non-ST elevated myocardial infarction) ([place])  Urinary tract infection associated with catheterization of urinary tract, unspecified indwelling urinary catheter type, initial encounter ([place])     Rx / DC Orders   ED Discharge Orders     None        Note:  This document was prepared using Dragon voice recognition software and may include unintentional dictation errors.   Nathaniel Man, MD 07/13/22 1259

## 2022-07-13 NOTE — Assessment & Plan Note (Signed)
Continue levothyroxine 

## 2022-07-13 NOTE — H&P (Addendum)
History and Physical   Joshua Rogers [id number] DOB: 1922-08-03 DOA: 07/13/2022  PCP: Idelle Crouch, MD  Outpatient Specialists: Dr. Clayborn Bigness, [facility] Cardiology Patient coming from: Home via EMS  I have personally briefly reviewed patient's old medical records in [place].  Chief Concern: Chest pain  HPI: Joshua Rogers is a 89 year old male with history of hypertension, hyperlipidemia, diet-controlled diabetes mellitus, GERD, CKD stage IIIa, BPH, atrial fibrillation on Eliquis, who presents emergency department for chief concerns of chest pain.  Initial vitals in the ED showed temperature 97.3, respiration rate 18, heart rate 85, blood pressure 122/69, SpO2 of 97% on room air.  Serum sodium 134, potassium 3.8, chloride 103, bicarb 23, BUN of 29, serum creatinine 1.67, EGFR 37, nonfasting blood glucose 361, WBC 5.5, hemoglobin 10.2, platelets of 212.  HS troponin was elevated at 1805.  ED treatment: None.  EDP spoke with [facility] clinic cardiology, Dr. Saralyn Pilar who recommends hospitalist admission. --------------------------------- At bedside, patient was able to tell me his name, age, the current calendar year and the current location.  He reports he developed chest pain about 2 days ago, and describes it as pressure and persistent.  He endorses associated left upper extremity pain.  He endorses baseline dyspnea with exertion that is the same.  He endorses dysuria that started about 3 to 4 days ago.  He denies diarrhea.  He endorses constipation and takes a daily laxative to help daily bowel movements.  He denies blood in his stool and blood in his urine.  She denies trauma to his person.  He states the chest pressure feels similar to prior episodes of heart attacks.  Social history: He is a former tobacco user, quitting more than 40 years ago.  He infrequently drinks EtOH and does not use recreational drug use.  He is retired and formerly worked in a  [employer].  ROS: Constitutional: no weight change, no fever ENT/Mouth: no sore throat, no rhinorrhea Eyes: no eye pain, no vision changes Cardiovascular: + chest pain, + dyspnea,  no edema, no palpitations Respiratory: no cough, no sputum, no wheezing Gastrointestinal: no nausea, no vomiting, no diarrhea, + constipation Genitourinary: no urinary incontinence, + dysuria, no hematuria Musculoskeletal: no arthralgias, no myalgias Skin: no skin lesions, no pruritus, Neuro: + weakness, no loss of consciousness, no syncope Psych: no anxiety, no depression, no decrease appetite Heme/Lymph: no bruising, no bleeding  ED Course: Discussed with emergency medicine provider, patient requiring hospitalization for chief concerns of NSTEMI.  Assessment/Plan  Principal Problem:   NSTEMI (non-ST elevated myocardial infarction) ([place]) Active Problems:   Diabetes mellitus type 2, noninsulin dependent (HCC)   HLD (hyperlipidemia)   Chronic atrial fibrillation with RVR (HCC)   Hypothyroidism   BPH (benign prostatic hyperplasia)   UTI (urinary tract infection)   CAD (coronary artery disease)   Pyuria   SIRS due to infectious process with acute organ dysfunction ([place])   DNR (do not resuscitate)   Assessment and Plan:  * NSTEMI (non-ST elevated myocardial infarction) ([place]) - Heparin per pharmacy, complete echo ordered - Per cardiology recommend to continue heparin GTT until Tuesday, 07/15/2022 AM - Cardiology has been consulted via secure chat, to [facility] clinic Dr. Saralyn Pilar - Nitroglycerin 0.4 mg sublingual every 5 minutes as needed for chest pain ordered - Morphine 2 g IV every 4 hours as needed for severe pain, 4 doses ordered - Admit to telemetry cardiac, inpatient  SIRS due to infectious process with acute organ dysfunction ([place]) -  Patient had elevated respiration rate, borderline hypothermia, source of urine as UA was positive for large leukocytes and positive for nitrates, renal and  cardiac organs were involved - Check blood cultures x 2, lactic acid x 2 - Add urine culture - Ceftriaxone 2 g IV daily, 5 days course ordered - Admit to telemetry cardiac, inpatient  Addendum: lactic acid was elevated, changed antibiotic to cefepime and vancomycin per pharmacy and metronidazole 500 mg IV BID, 5 days ordered; continue current acute care  UTI (urinary tract infection) Present on admission - Ceftriaxone 2 g IV daily, 5-day course ordered - Urine cultures in process  BPH (benign prostatic hyperplasia) With urinary retention - Patient self cath at home in order to urinate - As needed In-N-Out catheter as needed ordered  Addendum: Received message from nursing via secure chat, family requesting a chronic Foley catheter insertion - Foley catheter insertion ordered - AM team to order for discontinuation of Foley catheter when appropriate  Hypothyroidism - Levothyroxine 50 mcg daily resumed  Chronic atrial fibrillation with RVR ([place]) Patient took his Eliquis on 07/13/2022 in the morning - We will hold this while he is on heparin gtt.  HLD (hyperlipidemia) - Atorvastatin 40 mg nightly resumed  Diabetes mellitus type 2, noninsulin dependent (HCC) With hyperglycemia - Insulin aspart 6 units one-time dose ordered - Insulin SSI with at bedtime coverage ordered - Goal inpatient blood glucose levels 140-180  Chart reviewed.   DVT prophylaxis: Heparin GTT per pharmacy Code Status: DNR, confirmed with daughter Joshua Rogers at bedside Diet: Heart healthy/carb modified Family Communication: Discussed with daughter, Joshua Rogers at bedside with patient's permission Disposition Plan: Pending clinical course Consults called: Cardiology service Admission status: Telemetry cardiac, inpatient  Past Medical History:  Diagnosis Date   CKD (chronic kidney disease)    Diabetes ([place])    HLD (hyperlipidemia)    Hypertension    Past Surgical History:  Procedure Laterality Date    APPENDECTOMY     CATARACT EXTRACTION     Social History:  reports that he has quit smoking. He has never used smokeless tobacco. He reports that he does not currently use alcohol. He reports that he does not use drugs.  No Known Allergies Family History  Problem Relation Age of Onset   Stroke Father    Lung cancer Brother    Stroke Brother    Family history: Family history reviewed and not pertinent.  Prior to Admission medications   Medication Sig Start Date End Date Taking? Authorizing Provider  apixaban (ELIQUIS) 2.5 MG TABS tablet Take 1 tablet (2.5 mg total) by mouth 2 (two) times daily. 08/09/21   Sharen Hones, MD  aspirin 81 MG chewable tablet Chew 1 tablet (81 mg total) by mouth daily. 08/09/21   Sharen Hones, MD  atorvastatin (LIPITOR) 40 MG tablet Take 1 tablet (40 mg total) by mouth every evening. 08/09/21   Sharen Hones, MD  dextromethorphan-guaiFENesin [facility] DM) 30-600 MG 12hr tablet Take 1 tablet by mouth 2 (two) times daily as needed for cough. 08/30/21   Shawna Clamp, MD  finasteride (PROSCAR) 5 MG tablet Take 5 mg by mouth daily. 03/04/18   [provider]  furosemide (LASIX) 20 MG tablet Take 1 tablet (20 mg total) by mouth daily. 08/31/21   Shawna Clamp, MD  levothyroxine (SYNTHROID) 50 MCG tablet Take 50 mcg by mouth daily. 06/04/21   [provider]  metoprolol tartrate (LOPRESSOR) 25 MG tablet Take 2 tablets (50 mg total) by mouth 2 (two) times daily.  08/09/21   Sharen Hones, MD  tamsulosin (FLOMAX) 0.4 MG CAPS capsule Take 0.4 mg by mouth daily. 03/04/18   [provider]  zolpidem (AMBIEN) 10 MG tablet Take 10 mg by mouth at bedtime as needed for sleep.    [provider]   Physical Exam: Vitals:   07/13/22 1200 07/13/22 1300 07/13/22 1512 07/13/22 1517  BP: (!) 155/82 135/85 (!) 177/94 (!) 166/84  Pulse: 82 87 (!) 101   Resp: (!) 22 (!) 23 16   Temp: 97.6 F (36.4 C)     TempSrc: Oral     SpO2: 100% 100% 100%   Weight:     72.3 kg  Height:       Constitutional: appears age-appropriate, frail, NAD, calm, comfortable Eyes: PERRL, lids and conjunctivae normal ENMT: Mucous membranes are moist. Posterior pharynx clear of any exudate or lesions. Age-appropriate dentition. Hearing appropriate Neck: normal, supple, no masses, no thyromegaly Respiratory: clear to auscultation bilaterally, no wheezing, no crackles. Normal respiratory effort. No accessory muscle use.  Cardiovascular: Regular rate and rhythm, no murmurs / rubs / gallops.  Trace bilateral lower extremity edema. 2+ pedal pulses. No carotid bruits.  Abdomen: Obese abdomen, no tenderness, no masses palpated, no hepatosplenomegaly. Bowel sounds positive.  Musculoskeletal: no clubbing / cyanosis. No joint deformity upper and lower extremities. Good ROM, no contractures, no atrophy. Normal muscle tone.  Skin: no rashes, lesions, ulcers. No induration Neurologic: Sensation intact. Strength 5/5 in all 4.  Psychiatric: Normal judgment and insight. Alert and oriented x 3. Normal mood.   EKG: independently reviewed, showing atrial fibrillation with rate of 91, Qtc 445.  Chest x-ray on Admission: I personally reviewed and I agree with radiologist reading as below.  DG Chest 2 View  Result Date: 07/13/2022 CLINICAL DATA:  89 year old male with chest pain, midsternal pain radiating to the left arm. EXAM: CHEST - 2 VIEW COMPARISON:  Chest CTA 08/27/2021 and earlier. FINDINGS: AP and lateral views at 1039 hours. Calcified aortic atherosclerosis. Other mediastinal contours are within normal limits. Emphysema demonstrated on prior CTA. No pneumothorax, pulmonary edema, pleural effusion or confluent pulmonary opacity. No acute osseous abnormality identified. Negative visible bowel gas. IMPRESSION: 1. No acute cardiopulmonary abnormality. 2. Aortic Atherosclerosis (ICD10-I70.0) and Emphysema (ICD10-J43.9). Electronically Signed   By: Genevie Ann M.D.   On: 07/13/2022 10:44     Labs on Admission: I have personally reviewed following labs  CBC: Recent Labs  Lab 07/13/22 1028  WBC 5.5  HGB 10.2*  HCT 33.0*  MCV 82.9  PLT 99991111   Basic Metabolic Panel: Recent Labs  Lab 07/13/22 1028 07/13/22 1238  NA 134*  --   K 3.8  --   CL 103  --   CO2 23  --   GLUCOSE 361*  --   BUN 29*  --   CREATININE 1.67*  --   CALCIUM 8.6*  --   MG  --  2.1  PHOS  --  3.2   GFR: Estimated Creatinine Clearance: 25.1 mL/min (A) (by C-G formula based on SCr of 1.67 mg/dL (H)).  Urine analysis:    Component Value Date/Time   COLORURINE YELLOW (A) 07/13/2022 1152   APPEARANCEUR CLOUDY (A) 07/13/2022 1152   APPEARANCEUR Clear 04/17/2012 1954   LABSPEC 1.010 07/13/2022 1152   LABSPEC 1.005 04/17/2012 1954   PHURINE 6.0 07/13/2022 1152   GLUCOSEU 150 (A) 07/13/2022 1152   GLUCOSEU Negative 04/17/2012 1954   HGBUR SMALL (A) 07/13/2022 1152   BILIRUBINUR NEGATIVE  07/13/2022 1152   BILIRUBINUR Negative 04/17/2012 1954   KETONESUR NEGATIVE 07/13/2022 [place] 07/13/2022 1152   NITRITE POSITIVE (A) 07/13/2022 1152   LEUKOCYTESUR LARGE (A) 07/13/2022 1152   LEUKOCYTESUR 1+ 04/17/2012 1954   CRITICAL CARE Performed by: Dr. Tobie Poet  Total critical care time: 35 minutes  Critical care time was exclusive of separately billable procedures and treating other patients.  Critical care was necessary to treat or prevent imminent or life-threatening deterioration.  Critical care was time spent personally by me on the following activities: development of treatment plan with patient and/or surrogate as well as nursing, discussions with consultants, evaluation of patient's response to treatment, examination of patient, obtaining history from patient or surrogate, ordering and performing treatments and interventions, ordering and review of laboratory studies, ordering and review of radiographic studies, pulse oximetry and re-evaluation of patient's condition.  This  document was prepared using Dragon Voice Recognition software and may include unintentional dictation errors.  Dr. Tobie Poet [facility]  If 7PM-7AM, please contact overnight-coverage provider If 7AM-7PM, please contact day coverage provider [email or web address]  07/13/2022, 5:23 PM

## 2022-07-13 NOTE — ED Notes (Signed)
Pt states chest pain has subsided and refused more nitro.

## 2022-07-13 NOTE — Consult Note (Signed)
ANTICOAGULATION CONSULT NOTE - Initial Consult  Pharmacy Consult for Heparin Infusion Indication: chest pain/ACS  No Known Allergies  Patient Measurements: Height: 5' 9.5" (176.5 cm) Weight: 70.3 kg (155 lb) IBW/kg (Calculated) : 71.85 Heparin Dosing Weight: 70.3 kg  Vital Signs: Temp: 97.6 F (36.4 C) (02/25 1200) Temp Source: Oral (02/25 1200) BP: 135/85 (02/25 1300) Pulse Rate: 87 (02/25 1300)  Labs: Recent Labs    07/13/22 1028  HGB 10.2*  HCT 33.0*  PLT 212  CREATININE 1.67*  TROPONINIHS 1,805*    Estimated Creatinine Clearance: 24.6 mL/min (A) (by C-G formula based on SCr of 1.67 mg/dL (H)).   Medical History: Past Medical History:  Diagnosis Date   CKD (chronic kidney disease)    Diabetes ([place])    HLD (hyperlipidemia)    Hypertension     Medications:  Scheduled:   [START ON 07/14/2022] aspirin  81 mg Oral Daily   atorvastatin  40 mg Oral QPM   heparin  5,000 Units Subcutaneous Q8H   [START ON 07/14/2022] levothyroxine  50 mcg Oral QAC breakfast   metoprolol tartrate  50 mg Oral BID   Infusions:   cefTRIAXone (ROCEPHIN)  IV     PRN: acetaminophen **OR** acetaminophen, morphine injection, nitroGLYCERIN, ondansetron **OR** ondansetron (ZOFRAN) IV, senna-docusate, zolpidem  Assessment: Joshua Rogers is a 89 y.o. male presenting with chest pain. PMH significant for AF on Eliquis, HLD, T2DM, BPH, hypothyroidism, CAD. Patient was on [facility] PTA per chart review. Last dose of Eliquis 2.5 mg reported to be 2/25 AM. Pharmacy has been consulted to initiate and manage heparin infusion.   Baseline Labs: Hgb 10.2, Hct 33.0, Plt 212; aPTT, HL, PT, INR ordered  Goal of Therapy:  Heparin level 0.3-0.7 units/ml aPTT 60-102 seconds Monitor platelets by anticoagulation protocol: Yes   Plan:  No bolus since last dose of Eliquis was this morning Start heparin infusion at 850 units/hr Check HL in 8 hours  Continue to monitor aPTT until HL and aPTT correlate.  Check  HL daily for correlation until HL and aPTT correlate. Switch to HL monitoring once HL and aPTT correlate.   Gretel Acre, PharmD PGY1 Pharmacy Resident 07/13/2022 1:26 PM

## 2022-07-13 NOTE — ED Notes (Signed)
Pt reports chest pain for two or more days without relief. c/o left arm pain 2/10.

## 2022-07-13 NOTE — Assessment & Plan Note (Addendum)
Sepsis POA with tachypnea, hypothermia and  UA was consistent with infection. --Continue empiric IV Rocephin --Follow blood and urine cultures --Trend lactic acid (2.0 earlier today, given 250 cc) --Monitor fever curve, CBC, hemodynamics closely Sepsis physiology improved.  ADDENDUM AFTERNOON -- lactic acid trended up 2.0 >> 2.7.  Resumed on some maintenance fluids, NS @ 75 cc/hr.  Suspect dehydration more than sepsis at this point. Continue holding Lasix.  Discharge cancelled.

## 2022-07-13 NOTE — ED Notes (Signed)
Advised nurse that patient has ready bed 

## 2022-07-13 NOTE — Assessment & Plan Note (Signed)
-  Continue Lipitor °

## 2022-07-13 NOTE — Hospital Course (Addendum)
Mr. Eliezer Malena is a 89 year old male with history of hypertension, hyperlipidemia, diet-controlled diabetes mellitus, GERD, CKD stage IIIa, BPH, atrial fibrillation on Eliquis, who presents emergency department for chief concerns of chest pain.  Initial vitals in the ED showed temperature 97.3, respiration rate 18, heart rate 85, blood pressure 122/69, SpO2 of 97% on room air.  Serum sodium 134, potassium 3.8, chloride 103, bicarb 23, BUN of 29, serum creatinine 1.67, EGFR 37, nonfasting blood glucose 361, WBC 5.5, hemoglobin 10.2, platelets of 212.  HS troponin was elevated at 1805.  ED treatment: None.  EDP spoke with [facility] clinic cardiology, Dr. Saralyn Pilar who recommends hospitalist admission.

## 2022-07-13 NOTE — ED Triage Notes (Addendum)
Pt via [facility] from home. Pt c/o bilateral lung pain and mid-sternal chest pain that started yesterday that radiates to his L arm. Pt has a hx of a fib. Denies SOB. States that pain is now more in his L arm and L side of his chest. Pt does take Eliquis. Pt also reports some discomfort when he urinates. Pt is A&OX4 and NAD EMS report: 143/83, 91 CBG, 98%  on RA. Pt took 324 baby ASA PTA to EMS arrival. 20 G L forearm.

## 2022-07-13 NOTE — Consult Note (Signed)
[facility] Cardiology  CARDIOLOGY CONSULT NOTE  Patient ID: SHAWNELL GADALETA MRN: [id number] DOB/AGE: 87/18/1924 87 y.o.  Admit date: 07/13/2022 Referring Physician [facility] Primary Physician [facility] Primary Cardiologist [facility] Reason for Consultation NSTEMI  HPI: 89 year old male referred for evaluation of NSTEMI.  She has a history of chronic atrial fibrillation, on Eliquis.  He presents to [facility] ED with 2-day history of chest pain, described as "just a hur", without associated nausea, vomiting or diaphoresis.  She reports that 2 days ago, symptoms started with discomfort in his left arm.  In the emergency room, chest pain appears somewhat improved after sublingual nitroglycerin.  ECG reveals atrial fibrillation at a rate of 90 bpm without acute ischemic ST-T wave changes.  Admission labs were notable for elevated troponin of 1805.  Patient has a history of essential hypertension, blood pressure 155/82 on presentation.  Recent 2D echocardiogram 08/09/2021 revealed LVEF 60-65% with mild to moderate aortic insufficiency.  Review of systems complete and found to be negative unless listed above     Past Medical History:  Diagnosis Date   CKD (chronic kidney disease)    Diabetes ([place])    HLD (hyperlipidemia)    Hypertension     Past Surgical History:  Procedure Laterality Date   APPENDECTOMY     CATARACT EXTRACTION      (Not in a hospital admission)  Social History   Socioeconomic History   Marital status: Married    Spouse name: Not on file   Number of children: Not on file   Years of education: Not on file   Highest education level: Not on file  Occupational History   Not on file  Tobacco Use   Smoking status: Former   Smokeless tobacco: Never  Substance and Sexual Activity   Alcohol use: Yes   Drug use: Never   Sexual activity: Not on file  Other Topics Concern   Not on file  Social History Narrative   Not on file   Social Determinants of Health   Financial Resource Strain:  Not on file  Food Insecurity: Not on file  Transportation Needs: Not on file  Physical Activity: Not on file  Stress: Not on file  Social Connections: Not on file  Intimate Partner Violence: Not on file    Family History  Problem Relation Age of Onset   Stroke Father    Lung cancer Brother    Stroke Brother       Review of systems complete and found to be negative unless listed above      PHYSICAL EXAM  General: Well developed, well nourished, in no acute distress HEENT:  Normocephalic and atramatic Neck:  No JVD.  Lungs: Clear bilaterally to auscultation and percussion. Heart: HRRR . Normal S1 and S2 without gallops or murmurs.  Abdomen: Bowel sounds are positive, abdomen soft and non-tender  Msk:  Back normal, normal gait. Normal strength and tone for age. Extremities: No clubbing, cyanosis or edema.   Neuro: Alert and oriented X 3. Psych:  Good affect, responds appropriately  Labs:   Lab Results  Component Value Date   WBC 5.5 07/13/2022   HGB 10.2 (L) 07/13/2022   HCT 33.0 (L) 07/13/2022   MCV 82.9 07/13/2022   PLT 212 07/13/2022    Recent Labs  Lab 07/13/22 1028  NA 134*  K 3.8  CL 103  CO2 23  BUN 29*  CREATININE 1.67*  CALCIUM 8.6*  GLUCOSE 361*   Lab Results  Component Value Date  CKTOTAL 44 04/17/2012   CKMB 1.1 04/17/2012   TROPONINI <0.03 10/19/2018    Lab Results  Component Value Date   CHOL 121 08/09/2021   Lab Results  Component Value Date   HDL 47 08/09/2021   Lab Results  Component Value Date   LDLCALC 60 08/09/2021   Lab Results  Component Value Date   TRIG 70 08/09/2021   Lab Results  Component Value Date   CHOLHDL 2.6 08/09/2021   No results found for: "LDLDIRECT"    Radiology: DG Chest 2 View  Result Date: 07/13/2022 CLINICAL DATA:  89 year old male with chest pain, midsternal pain radiating to the left arm. EXAM: CHEST - 2 VIEW COMPARISON:  Chest CTA 08/27/2021 and earlier. FINDINGS: AP and lateral views at  1039 hours. Calcified aortic atherosclerosis. Other mediastinal contours are within normal limits. Emphysema demonstrated on prior CTA. No pneumothorax, pulmonary edema, pleural effusion or confluent pulmonary opacity. No acute osseous abnormality identified. Negative visible bowel gas. IMPRESSION: 1. No acute cardiopulmonary abnormality. 2. Aortic Atherosclerosis (ICD10-I70.0) and Emphysema (ICD10-J43.9). Electronically Signed   By: Genevie Ann M.D.   On: 07/13/2022 10:44    EKG: Atrial fibrillation at 90 bpm with nonspecific ST-T wave abnormalities  ASSESSMENT AND PLAN:   NSTEMI, new onset chest pain x 2 days, with nondiagnostic ECG, elevated troponin (1805), improved after sublingual nitroglycerin Chronic atrial fibrillation, rate controlled, on Eliquis for stroke prevention Essential hypertension, systolic blood pressure mildly elevated on current BP medications CKD stage IIIb, BUN and creatinine 29 and 1.67, respectively  Recommendations  1.  Agree with current therapy 2.  Continue heparin infusion 48 hours 3.  Hold Eliquis during heparin infusion 4.  Add Nitropaste 5.  Continue metoprolol tartrate 50 mg twice daily 6.  Start atorvastatin 40 mg daily 7.  Reviewed 2D echocardiogram 8.  Defer cardiac catheterization in lieu of patient's advanced age.  Signed: Isaias Cowman MD,PhD, [facility] 07/13/2022, 1:21 PM

## 2022-07-14 ENCOUNTER — Inpatient Hospital Stay
Admit: 2022-07-14 | Discharge: 2022-07-14 | Disposition: A | Discharge status: Home / Self Care | Payer: Medicare Other | Attending: Internal Medicine | Admitting: Internal Medicine

## 2022-07-14 DIAGNOSIS — I214 Non-ST elevation (NSTEMI) myocardial infarction: Secondary | ICD-10-CM

## 2022-07-14 LAB — BASIC METABOLIC PANEL
Anion gap: 7 (ref 5–15)
BUN: 30 mg/dL — ABNORMAL HIGH (ref 8–23)
CO2: 23 mmol/L (ref 22–32)
Calcium: 8.3 mg/dL — ABNORMAL LOW (ref 8.9–10.3)
Chloride: 106 mmol/L (ref 98–111)
Creatinine, Ser: 1.58 mg/dL — ABNORMAL HIGH (ref 0.61–1.24)
GFR, Estimated: 39 mL/min — ABNORMAL LOW (ref >60)
Glucose, Bld: 164 mg/dL — ABNORMAL HIGH (ref 70–99)
Potassium: 4 mmol/L (ref 3.5–5.1)
Sodium: 136 mmol/L (ref 135–145)

## 2022-07-14 LAB — GLUCOSE, CAPILLARY
Glucose-Capillary: 210 mg/dL — ABNORMAL HIGH (ref 70–99)
Glucose-Capillary: 136 mg/dL — ABNORMAL HIGH (ref 70–99)
Glucose-Capillary: 207 mg/dL — ABNORMAL HIGH (ref 70–99)
Glucose-Capillary: 80 mg/dL (ref 70–99)

## 2022-07-14 LAB — HEPARIN LEVEL (UNFRACTIONATED): Heparin Unfractionated: >1.1 IU/mL — ABNORMAL HIGH (ref 0.30–0.70)

## 2022-07-14 LAB — CBC
HCT: 30.3 % — ABNORMAL LOW (ref 39.0–52.0)
Hemoglobin: 9.6 g/dL — ABNORMAL LOW (ref 13.0–17.0)
MCH: 25.8 pg — ABNORMAL LOW (ref 26.0–34.0)
MCHC: 31.7 g/dL (ref 30.0–36.0)
MCV: 81.5 fL (ref 80.0–100.0)
Platelets: 183 10*3/uL (ref 150–400)
RBC: 3.72 MIL/uL — ABNORMAL LOW (ref 4.22–5.81)
RDW: 18 % — ABNORMAL HIGH (ref 11.5–15.5)
WBC: 6.8 10*3/uL (ref 4.0–10.5)
nRBC: 0 % (ref 0.0–0.2)

## 2022-07-14 LAB — APTT
aPTT: 76 seconds — ABNORMAL HIGH (ref 24–36)
aPTT: 77 seconds — ABNORMAL HIGH (ref 24–36)

## 2022-07-14 LAB — LACTIC ACID, PLASMA: Lactic Acid, Venous: 2.5 mmol/L (ref 0.5–1.9)

## 2022-07-14 LAB — HEMOGLOBIN A1C
Hgb A1c MFr Bld: 8.4 % — ABNORMAL HIGH (ref 4.8–5.6)
Mean Plasma Glucose: 194 mg/dL

## 2022-07-14 MED ORDER — SODIUM CHLORIDE 0.9 % IV SOLN
2.0000 g | INTRAVENOUS | Status: DC
  Administered 2022-07-14: 2 g via INTRAVENOUS
  Filled 2022-07-14: qty 2

## 2022-07-14 MED ORDER — ACETAMINOPHEN 650 MG RE SUPP: 650.0000 mg | Freq: Four times a day (QID) | RECTAL | Status: DC | PRN

## 2022-07-14 MED ORDER — ACETAMINOPHEN 325 MG PO TABS: 650.0000 mg | ORAL_TABLET | Freq: Four times a day (QID) | ORAL | Status: DC | PRN

## 2022-07-14 NOTE — Progress Notes (Signed)
Progress Note   Patient: Joshua Rogers [id number] DOB: April 16, 1923 DOA: 07/13/2022     1 DOS: the patient was seen and examined on 07/14/2022   Brief hospital course: Mr. Joshua Rogers is a 89 year old male with history of hypertension, hyperlipidemia, diet-controlled diabetes mellitus, GERD, CKD stage IIIa, BPH, atrial fibrillation on Eliquis, who presented to the ED on 07/13/2022 for evaluation of chest pain.  Initial vitals in the ED were stable.   Labs were notable for creatinine 1.67, glucose 361, and mild anemia Hbg 10.2.  Initial hs-troponin was elevated at 1805, trended up to 3062. Urinalysis with positive nitrite, large leukocytes, rare bacteria > 50 wbc's.  Patient was admitted to medicine service with cardiology consulted. Started on heparin drip for NSTEMI, and IV antibiotics for UTI pending culture results.   Cardiology recommended conservative, medical management. No plan for cardiac cath at this time.   Assessment and Plan: * NSTEMI (non-ST elevated myocardial infarction) ([place]) Presented with chest pain, hs-troponin 1805 >> 3062. --Mgmt per Cardiology --On heparin drip --No plan for invasive procedures, conservative medical mgmt recommended --Nitropaste started --PRN SL nitro, IV morphine --Continue metoprolol, Lipitor --Follow pending Echo   Sepsis secondary to UTI ([place]) Sepsis POA with tachypnea, hypothermia and  UA was consistent with infection. --Continue empiric IV Rocephin --Follow blood and urine cultures --Lactic acid normalized, I de-escalated Vanc/Cefepime/Flagyl to Rocephin this AM --Monitor fever curve, CBC, hemodynamics closely Sepsis physiology improved.  UTI (urinary tract infection) As above  BPH (benign prostatic hyperplasia) Chronic urinary retention - Patient does self cath at home  - Foley placed on admission at family's request -- Admitted with UTI on empiric IV antibiotic -- UTI likely due to retention   CAD (coronary artery  disease) See NSTEMI  Chronic atrial fibrillation with RVR (HCC) Hold Eliquis while on heparin gtt for NSTEMI. Continue metoprolol tartrate 50 mg PO BID. Telemetry monitoring.  Diabetes mellitus type 2, noninsulin dependent ([place]) With hyperglycemia, glucose 361 on admission --Sliding scale Novolog --Adjust for goal 140-180  DNR (do not resuscitate) Noted  Hypothyroidism Continue levothyroxine   HLD (hyperlipidemia) Continue Lipitor  Pyuria See UTI        Subjective: Pt awake sitting up in bed, echo being done when seen this AM.  He reports slight ongoing chest discomfort.  Had a "twinge" of CP with ambulating to restroom earlier.  No other acute complaints.   Physical Exam: Vitals:   07/13/22 2312 07/14/22 0324 07/14/22 0758 07/14/22 1155  BP: 105/64 107/71 130/78 120/77  Pulse: 85 89 90 92  Resp: '16 14 18 19  '$ Temp: 97.6 F (36.4 C) 97.6 F (36.4 C) (!) 97.5 F (36.4 C) 97.8 F (36.6 C)  TempSrc:  Oral Oral   SpO2: 95% 99% 94% 100%  Weight:      Height:       General exam: awake, alert, no acute distress HEENT: atraumatic, clear conjunctiva, anicteric sclera, moist mucus membranes, hearing grossly normal  Respiratory system: CTAB, no wheezes, rales or rhonchi, normal respiratory effort. Cardiovascular system: normal S1/S2, RRR, no pedal edema.   Gastrointestinal system: soft, NT, ND, no HSM felt, +bowel sounds. Central nervous system: A&O x4. no gross focal neurologic deficits, normal speech Extremities: moves all , no edema, normal tone Skin: dry, intact, normal temperature, normal color, No rashes, lesions or ulcers Psychiatry: normal mood, congruent affect, judgement and insight appear normal  Data Reviewed:  Notable labs --- troponin 1805 >> 3061,   Family Communication: None present. Will attempt  to call.  Disposition: Status is: Inpatient Remains inpatient appropriate because: Remain on IV heparin and IV antibiotic. Discharge pending cardiology  clearance and urine culture results.    Planned Discharge Destination: Home    Time spent: 42 minutes  Author: Ezekiel Slocumb, DO 07/14/2022 12:59 PM  For on call review [email or web address].

## 2022-07-14 NOTE — Progress Notes (Signed)
*  PRELIMINARY RESULTS* Echocardiogram 2D Echocardiogram has been performed.  Sherrie Sport 07/14/2022, 9:42 AM

## 2022-07-14 NOTE — Assessment & Plan Note (Signed)
Noted  

## 2022-07-14 NOTE — Consult Note (Signed)
ANTICOAGULATION CONSULT NOTE   Pharmacy Consult for Heparin Infusion Indication: chest pain/ACS  No Known Allergies  Patient Measurements: Height: 5' 9.5" (176.5 cm) Weight: 72.3 kg (159 lb 4.8 oz) IBW/kg (Calculated) : 71.85 Heparin Dosing Weight: 70.3 kg  Vital Signs: Temp: 97.6 F (36.4 C) (02/25 2312) Temp Source: Oral (02/25 1929) BP: 105/64 (02/25 2312) Pulse Rate: 85 (02/25 2312)  Labs: Recent Labs    07/13/22 1028 07/13/22 1228 07/13/22 1532 07/14/22 0004  HGB 10.2*  --   --   --   HCT 33.0*  --   --   --   PLT 212  --   --   --   APTT  --   --  33 76*  LABPROT  --   --  14.9  --   INR  --   --  1.2  --   HEPARINUNFRC  --   --  >1.10*  --   CREATININE 1.67*  --   --   --   TROPONINIHS 1,805* 3,062*  --   --      Estimated Creatinine Clearance: 25.1 mL/min (A) (by C-G formula based on SCr of 1.67 mg/dL (H)).   Medical History: Past Medical History:  Diagnosis Date   CKD (chronic kidney disease)    Diabetes ([place])    HLD (hyperlipidemia)    Hypertension     Medications:  Scheduled:   aspirin  81 mg Oral Daily   atorvastatin  40 mg Oral QPM   Chlorhexidine Gluconate Cloth  6 each Topical Q0600   finasteride  5 mg Oral Daily   insulin aspart  0-5 Units Subcutaneous QHS   insulin aspart  0-9 Units Subcutaneous TID WC   insulin aspart  6 Units Subcutaneous Once   levothyroxine  50 mcg Oral QAC breakfast   metoprolol tartrate  50 mg Oral BID   nitroGLYCERIN  0.5 inch Topical Q6H   Infusions:   ceFEPime (MAXIPIME) IV 2 g (07/13/22 2102)   heparin 850 Units/hr (07/13/22 1543)   metronidazole 500 mg (07/13/22 1828)   vancomycin 1,250 mg (07/13/22 1822)   PRN: acetaminophen **OR** acetaminophen, benzonatate, guaiFENesin, morphine injection, ondansetron **OR** ondansetron (ZOFRAN) IV, senna-docusate, zolpidem  Assessment: Joshua Rogers is a 89 y.o. male presenting with chest pain. PMH significant for AF on Eliquis, HLD, T2DM, BPH,  hypothyroidism, CAD. Patient was on [facility] PTA per chart review. Last dose of Eliquis 2.5 mg reported to be 2/25 AM. Pharmacy has been consulted to initiate and manage heparin infusion.   Baseline Labs: Hgb 10.2, Hct 33.0, Plt 212; aPTT, HL, PT, INR ordered  Goal of Therapy:  Heparin level 0.3-0.7 units/ml aPTT 60-102 seconds Monitor platelets by anticoagulation protocol: Yes   2/26 0004 aPTT 76, therapeutic x 1  Plan:  Continue heparin infusion at 850 units/hr Recheck aPTT in 8 hours to confirm  Continue to monitor aPTT until HL and aPTT correlate.  Check HL daily for correlation until HL and aPTT correlate. Switch to HL monitoring once HL and aPTT correlate.   Renda Rolls, PharmD, [facility] 07/14/2022 12:57 AM

## 2022-07-14 NOTE — Assessment & Plan Note (Signed)
See NSTEMI

## 2022-07-14 NOTE — Consult Note (Addendum)
ANTICOAGULATION CONSULT NOTE - Follow Up Consult  Pharmacy Consult for heparin infusion Indication: chest pain/ACS  No Known Allergies  Patient Measurements: Height: 5' 9.5" (176.5 cm) Weight: 72.3 kg (159 lb 4.8 oz) IBW/kg (Calculated) : 71.85 Heparin Dosing Weight: 70.3 kg  Vital Signs: Temp: 97.5 F (36.4 C) (02/26 0758) Temp Source: Oral (02/26 0758) BP: 130/78 (02/26 0758) Pulse Rate: 90 (02/26 0758)  Labs: Recent Labs    07/13/22 1028 07/13/22 1228 07/13/22 1532 07/14/22 0004 07/14/22 0626 07/14/22 0826  HGB 10.2*  --   --   --  9.6*  --   HCT 33.0*  --   --   --  30.3*  --   PLT 212  --   --   --  183  --   APTT  --   --  33 76*  --  77*  LABPROT  --   --  14.9  --   --   --   INR  --   --  1.2  --   --   --   HEPARINUNFRC  --   --  >1.10*  --   --  >1.10*  CREATININE 1.67*  --   --   --  1.58*  --   TROPONINIHS 1,805* 3,062*  --   --   --   --     Estimated Creatinine Clearance: 26.5 mL/min (A) (by C-G formula based on SCr of 1.58 mg/dL (H)).   Medications:  PTA: Eliquis 2.5 mg BID Inpatient: heparin infusion (2/25 >>) Allergies: No known allergies  Assessment: 89 year old male with a PMH of atrial fibrillation on Eliquis (last dose reported to be on 2/25 AM), HLD, DM2, BPH, and CKD IIIa. Patient presented to ED with ongoing left arm and chest pain (started on 2/23). He has significantly elevated troponin of 1805 and has afib with HR 90 bpm with nonspecific ST-T wave abnormalities. Pharmacy consulted for management of heparin infusion in the setting of chest pain.  Goal of Therapy:  aPTT 60-102 seconds  Monitor platelets by anticoagulation protocol: Yes  2/26 0004 aPTT 76 therapeutic x 1 2/26 0826 aPTT 77 therapeutic x 2 HL > 1.1  Plan:  aPTT is therapeutic Continue heparin infusion at 850 units/hr Recheck aPTT/HL/CBC with AM labs Switch to HL monitoring once HL and aPTT correlate with a HL goal of 0.3-0.7 units/mL  Avy Barlett 07/14/2022,9:22 AM

## 2022-07-14 NOTE — Assessment & Plan Note (Signed)
See UTI

## 2022-07-14 NOTE — Progress Notes (Signed)
[facility] Cardiology  CARDIOLOGY CONSULT NOTE  Patient ID: Joshua Rogers MRN: [id number] DOB/AGE: Mar 03, 1923 89 y.o.  Admit date: 07/13/2022 Referring Physician [facility] Primary Physician [facility] Primary Cardiologist [facility] Reason for Consultation NSTEMI  HPI: Joshua Rogers is a 89 year old male referred for evaluation of NSTEMI.  He has a history of chronic atrial fibrillation, on Eliquis.  He presents to [facility] ED with 2-day history of chest pain, described as "just a hurt", without associated nausea, vomiting or diaphoresis.  She reports that 2 days ago, symptoms started with discomfort in his left arm.  In the emergency room, chest pain appears somewhat improved after sublingual nitroglycerin.  ECG reveals atrial fibrillation at a rate of 90 bpm without acute ischemic ST-T wave changes.  Admission labs were notable for elevated troponin of 1805.  Patient has a history of essential hypertension, blood pressure 155/82 on presentation.  Recent 2D echocardiogram 08/09/2021 revealed LVEF 60-65% with mild to moderate aortic insufficiency, mild AS.  Interval History:  - when ambulatory to the bathroom, had some dyspnea and mild "hurting" in this chest, resolved once back in bed + nitropaste - hemodynamically stable, ate most of his breakfast - echo performed this AM, read pending  Review of systems complete and found to be negative unless listed above     Past Medical History:  Diagnosis Date   CKD (chronic kidney disease)    Diabetes ([place])    HLD (hyperlipidemia)    Hypertension     Past Surgical History:  Procedure Laterality Date   APPENDECTOMY     CATARACT EXTRACTION      Medications Prior to Admission  Medication Sig Dispense Refill Last Dose   apixaban (ELIQUIS) 2.5 MG TABS tablet Take 1 tablet (2.5 mg total) by mouth 2 (two) times daily. 60 tablet 0 07/13/2022 at 0800   dextromethorphan-guaiFENesin (MUCINEX DM) 30-600 MG 12hr tablet Take 1 tablet by mouth 2 (two) times daily as  needed for cough. 10 tablet 0 unk   finasteride (PROSCAR) 5 MG tablet Take 5 mg by mouth daily.   07/12/2022 at 0700   furosemide (LASIX) 20 MG tablet Take 1 tablet (20 mg total) by mouth daily. 30 tablet 1 07/13/2022   levothyroxine (SYNTHROID) 50 MCG tablet Take 50 mcg by mouth daily.   07/13/2022 at 0600   metoprolol tartrate (LOPRESSOR) 25 MG tablet Take 2 tablets (50 mg total) by mouth 2 (two) times daily. 60 tablet 0 07/13/2022 at 0800   zolpidem (AMBIEN) 10 MG tablet Take 10 mg by mouth at bedtime as needed for sleep.   07/12/2022   aspirin 81 MG chewable tablet Chew 1 tablet (81 mg total) by mouth daily. (Patient not taking: Reported on 07/13/2022) 30 tablet 0 Not Taking   atorvastatin (LIPITOR) 40 MG tablet Take 1 tablet (40 mg total) by mouth every evening. (Patient not taking: Reported on 07/13/2022) 30 tablet 0 Not Taking   tamsulosin (FLOMAX) 0.4 MG CAPS capsule Take 0.4 mg by mouth daily. (Patient not taking: Reported on 07/13/2022)   Not Taking    Social History   Socioeconomic History   Marital status: Married    Spouse name: Not on file   Number of children: Not on file   Years of education: Not on file   Highest education level: Not on file  Occupational History   Not on file  Tobacco Use   Smoking status: Former   Smokeless tobacco: Never  Substance and Sexual Activity   Alcohol use: Not Currently  Drug use: Never   Sexual activity: Not Currently  Other Topics Concern   Not on file  Social History Narrative   Not on file   Social Determinants of Health   Financial Resource Strain: Not on file  Food Insecurity: No Food Insecurity (07/13/2022)   Hunger Vital Sign    Worried About Running Out of Food in the Last Year: Never true    Ran Out of Food in the Last Year: Never true  Transportation Needs: No Transportation Needs (07/13/2022)   PRAPARE - Hydrologist (Medical): No    Lack of Transportation (Non-Medical): No  Physical Activity:  Not on file  Stress: Not on file  Social Connections: Not on file  Intimate Partner Violence: Not At Risk (07/13/2022)   Humiliation, Afraid, Rape, and Kick questionnaire    Fear of Current or Ex-Partner: No    Emotionally Abused: No    Physically Abused: No    Sexually Abused: No    Family History  Problem Relation Age of Onset   Stroke Father    Lung cancer Brother    Stroke Brother     PHYSICAL EXAM  General: elderly and thin caucasian male, sitting up in bed with son at bedside HEENT:  Normocephalic and atramatic Neck:  No JVD.  Lungs: Normal respiratory effort on room air. Clear bilaterally to auscultation. Heart: irregularly irregular with controlled rate. S1 present with soft S2, High pitched 3/6 systolic murmur best heard at apex Abdomen: non distended appearing Msk:  Back normal, normal gait. Normal strength and tone for age. Extremities: No clubbing, cyanosis or edema.   Neuro: Alert and oriented X 3. Psych:  Good affect, responds appropriately  Labs:   Lab Results  Component Value Date   WBC 6.8 07/14/2022   HGB 9.6 (L) 07/14/2022   HCT 30.3 (L) 07/14/2022   MCV 81.5 07/14/2022   PLT 183 07/14/2022    Recent Labs  Lab 07/14/22 0626  NA 136  K 4.0  CL 106  CO2 23  BUN 30*  CREATININE 1.58*  CALCIUM 8.3*  GLUCOSE 164*    Lab Results  Component Value Date   CKTOTAL 44 04/17/2012   CKMB 1.1 04/17/2012   TROPONINI <0.03 10/19/2018    Lab Results  Component Value Date   CHOL 121 08/09/2021   Lab Results  Component Value Date   HDL 47 08/09/2021   Lab Results  Component Value Date   LDLCALC 60 08/09/2021   Lab Results  Component Value Date   TRIG 70 08/09/2021   Lab Results  Component Value Date   CHOLHDL 2.6 08/09/2021   No results found for: "LDLDIRECT"    Radiology: DG Chest 2 View  Result Date: 07/13/2022 CLINICAL DATA:  89 year old male with chest pain, midsternal pain radiating to the left arm. EXAM: CHEST - 2 VIEW COMPARISON:   Chest CTA 08/27/2021 and earlier. FINDINGS: AP and lateral views at 1039 hours. Calcified aortic atherosclerosis. Other mediastinal contours are within normal limits. Emphysema demonstrated on prior CTA. No pneumothorax, pulmonary edema, pleural effusion or confluent pulmonary opacity. No acute osseous abnormality identified. Negative visible bowel gas. IMPRESSION: 1. No acute cardiopulmonary abnormality. 2. Aortic Atherosclerosis (ICD10-I70.0) and Emphysema (ICD10-J43.9). Electronically Signed   By: Genevie Ann M.D.   On: 07/13/2022 10:44    EKG: Atrial fibrillation at 90 bpm with nonspecific ST-T wave abnormalities  ASSESSMENT AND PLAN:   NSTEMI, new onset chest pain x 2 days, with nondiagnostic  ECG, elevated troponin (1805), improved after sublingual nitroglycerin Mild aortic stenosis w/ mild - mod aortic insufficiency by echo 07/2021 Acute cystitis, on ceftriaxone, culture pending Chronic atrial fibrillation, rate controlled, on Eliquis for stroke prevention Essential hypertension, systolic blood pressure mildly elevated on current BP medications CKD stage IIIb, BUN and creatinine 29 and 1.67, respectively  Recommendations  1.  Agree with current therapy 2.  Continue heparin infusion 48 hours, ending 2/27 at 1300 3.  Hold Eliquis during heparin infusion 4.  continue Nitropaste, consider Imdur  5.  Continue metoprolol tartrate 50 mg twice daily 6.  Continue atorvastatin 40 mg daily 7.  Review 2D echocardiogram 8.  Continue to defer cardiac catheterization in lieu of patient's advanced age. Patient and his son are in agreement  This patient's plan of care was discussed and created with Dr. Clayborn Bigness and he is in agreement.    Signed: Alanson Puls Elby Blackwelder PA-C 07/14/2022, 10:20 AM

## 2022-07-14 NOTE — TOC Initial Note (Signed)
Transition of Care [facility]) - Initial/Assessment Note    Patient Details  Name: Joshua Rogers MRN: [id number] Date of Birth: 1922-09-26  Transition of Care [facility]) CM/SW Contact:    Laurena Slimmer, RN Phone Number: 07/14/2022, 2:13 PM  Clinical Narrative:                  Transition of Care [facility]) Screening Note   Patient Details  Name: Joshua Rogers Date of Birth: February 02, 1923   Transition of Care [facility]) CM/SW Contact:    Laurena Slimmer, RN Phone Number: 07/14/2022, 2:13 PM    Transition of Care Department [facility]) has reviewed patient and no TOC needs have been identified at this time. We will continue to monitor patient advancement through interdisciplinary progression rounds. If new patient transition needs arise, please place a TOC consult.          Patient Goals and CMS Choice            Expected Discharge Plan and Services                                              Prior Living Arrangements/Services                       Activities of Daily Living Home Assistive Devices/Equipment: Gilford Rile (specify type) ADL Screening (condition at time of admission) Patient's cognitive ability adequate to safely complete daily activities?: Yes Is the patient deaf or have difficulty hearing?: Yes Does the patient have difficulty seeing, even when wearing glasses/contacts?: No Does the patient have difficulty concentrating, remembering, or making decisions?: No Patient able to express need for assistance with ADLs?: No Does the patient have difficulty dressing or bathing?: No Independently performs ADLs?: Yes (appropriate for developmental age) Does the patient have difficulty walking or climbing stairs?: No Weakness of Legs: Both Weakness of Arms/Hands: None  Permission Sought/Granted                  Emotional Assessment              Admission diagnosis:  NSTEMI (non-ST elevated myocardial infarction) ([place]) [I21.4] Urinary tract  infection associated with catheterization of urinary tract, unspecified indwelling urinary catheter type, initial encounter ([place]) [id number], N39.0] Patient Active Problem List   Diagnosis Date Noted   Pyuria 07/13/2022   Sepsis secondary to UTI ([place]) 07/13/2022   DNR (do not resuscitate) 07/13/2022   SOB (shortness of breath) 08/27/2021   Acute on chronic diastolic CHF (congestive heart failure) ([place]) 08/27/2021   UTI (urinary tract infection) 08/27/2021   Elevated troponin 08/27/2021   Acute bronchitis 08/27/2021   Chronic diastolic CHF (congestive heart failure) ([place]) 08/27/2021   CAD (coronary artery disease) 08/27/2021   Atrial fibrillation with rapid ventricular response ([place])    Chest pain 08/08/2021   NSTEMI (non-ST elevated myocardial infarction) ([place]) 08/08/2021   Chronic kidney disease, stage 3a ([place]) 08/08/2021   Chronic atrial fibrillation with RVR ([place]) 08/08/2021   Hypothyroidism 08/08/2021   BPH (benign prostatic hyperplasia) 08/08/2021   Syncope 10/19/2018   Diabetes mellitus type 2, noninsulin dependent ([place]) 10/19/2018   HTN (hypertension) 10/19/2018   HLD (hyperlipidemia) 10/19/2018   Accelerated hypertension 10/19/2018   CKD (chronic kidney disease), stage III ([place]) 10/19/2018   PCP:  Idelle Crouch, MD Pharmacy:   [id number]  [place] - [address] [place] [address] Phone: [phone] Fax: [phone]     Social Determinants of Health (SDOH) Social History: SDOH Screenings   Food Insecurity: No Food Insecurity (07/13/2022)  Housing: Low Risk  (07/13/2022)  Transportation Needs: No Transportation Needs (07/13/2022)  Utilities: Not At Risk (07/13/2022)  Tobacco Use: Medium Risk (07/13/2022)   SDOH Interventions:     Readmission Risk Interventions     No data to display

## 2022-07-15 DIAGNOSIS — I214 Non-ST elevation (NSTEMI) myocardial infarction: Secondary | ICD-10-CM | POA: Diagnosis not present

## 2022-07-15 LAB — LACTIC ACID, PLASMA
Lactic Acid, Venous: 2 mmol/L (ref 0.5–1.9)
Lactic Acid, Venous: 2.7 mmol/L (ref 0.5–1.9)

## 2022-07-15 LAB — CBC
HCT: 29.1 % — ABNORMAL LOW (ref 39.0–52.0)
Hemoglobin: 9 g/dL — ABNORMAL LOW (ref 13.0–17.0)
MCH: 24.9 pg — ABNORMAL LOW (ref 26.0–34.0)
MCHC: 30.9 g/dL (ref 30.0–36.0)
MCV: 80.6 fL (ref 80.0–100.0)
Platelets: 197 10*3/uL (ref 150–400)
RBC: 3.61 MIL/uL — ABNORMAL LOW (ref 4.22–5.81)
RDW: 18 % — ABNORMAL HIGH (ref 11.5–15.5)
WBC: 5.6 10*3/uL (ref 4.0–10.5)
nRBC: 0 % (ref 0.0–0.2)

## 2022-07-15 LAB — ECHOCARDIOGRAM COMPLETE
AR max vel: 0.98 cm2
AV Area VTI: 1.1 cm2
AV Area mean vel: 0.93 cm2
AV Mean grad: 12.3 mmHg
AV Peak grad: 22 mmHg
Ao pk vel: 2.35 m/s
Area-P 1/2: 5.02 cm2
Calc EF: 35.7 %
Height: 69.5 in
P 1/2 time: 407 ms
S' Lateral: 2.8 cm
Single Plane A2C EF: 41.6 %
Single Plane A4C EF: 25.8 %
Weight: 2548.8 [oz_av]

## 2022-07-15 LAB — GLUCOSE, CAPILLARY
Glucose-Capillary: 131 mg/dL — ABNORMAL HIGH (ref 70–99)
Glucose-Capillary: 283 mg/dL — ABNORMAL HIGH (ref 70–99)
Glucose-Capillary: 131 mg/dL — ABNORMAL HIGH (ref 70–99)
Glucose-Capillary: 127 mg/dL — ABNORMAL HIGH (ref 70–99)

## 2022-07-15 LAB — BASIC METABOLIC PANEL
Anion gap: 9 (ref 5–15)
BUN: 34 mg/dL — ABNORMAL HIGH (ref 8–23)
CO2: 20 mmol/L — ABNORMAL LOW (ref 22–32)
Calcium: 8 mg/dL — ABNORMAL LOW (ref 8.9–10.3)
Chloride: 106 mmol/L (ref 98–111)
Creatinine, Ser: 1.45 mg/dL — ABNORMAL HIGH (ref 0.61–1.24)
GFR, Estimated: 44 mL/min — ABNORMAL LOW (ref >60)
Glucose, Bld: 110 mg/dL — ABNORMAL HIGH (ref 70–99)
Potassium: 3.7 mmol/L (ref 3.5–5.1)
Sodium: 135 mmol/L (ref 135–145)

## 2022-07-15 LAB — URINE CULTURE: Culture: >=100000 — AB

## 2022-07-15 LAB — LIPID PANEL
Cholesterol: 88 mg/dL (ref 0–200)
HDL: 42 mg/dL (ref >40)
LDL Cholesterol: 41 mg/dL (ref 0–99)
Total CHOL/HDL Ratio: 2.1 RATIO
Triglycerides: 25 mg/dL (ref <150)
VLDL: 5 mg/dL (ref 0–40)

## 2022-07-15 LAB — HEPARIN LEVEL (UNFRACTIONATED): Heparin Unfractionated: 0.74 IU/mL — ABNORMAL HIGH (ref 0.30–0.70)

## 2022-07-15 LAB — APTT: aPTT: 82 seconds — ABNORMAL HIGH (ref 24–36)

## 2022-07-15 MED ORDER — METOPROLOL TARTRATE 25 MG PO TABS: 25.0000 mg | ORAL_TABLET | Freq: Two times a day (BID) | ORAL | 2 refills | Status: DC

## 2022-07-15 MED ORDER — SODIUM CHLORIDE 0.9 % IV BOLUS
250.0000 mL | Freq: Once | INTRAVENOUS | Status: AC
  Administered 2022-07-15: 250 mL via INTRAVENOUS

## 2022-07-15 MED ORDER — SODIUM CHLORIDE 0.9 % IV SOLN: INTRAVENOUS | Status: DC

## 2022-07-15 MED ORDER — SODIUM CHLORIDE 0.9 % IV SOLN
2.0000 g | INTRAVENOUS | Status: DC
  Administered 2022-07-15: 2 g via INTRAVENOUS
  Filled 2022-07-15 (×2): qty 20

## 2022-07-15 MED ORDER — CEFADROXIL 500 MG PO CAPS: 500.0000 mg | ORAL_CAPSULE | Freq: Two times a day (BID) | ORAL | 0 refills | Status: DC

## 2022-07-15 MED ORDER — ISOSORBIDE MONONITRATE ER 30 MG PO TB24
30.0000 mg | ORAL_TABLET | Freq: Every day | ORAL | Status: DC
  Administered 2022-07-15 – 2022-07-16 (×2): 30 mg via ORAL
  Filled 2022-07-15 (×2): qty 1

## 2022-07-15 MED ORDER — ISOSORBIDE MONONITRATE ER 30 MG PO TB24: 30.0000 mg | ORAL_TABLET | Freq: Every day | ORAL | 2 refills | Status: DC

## 2022-07-15 MED ORDER — CEFADROXIL 500 MG PO CAPS
500.0000 mg | ORAL_CAPSULE | Freq: Two times a day (BID) | ORAL | Status: DC
  Administered 2022-07-15: 500 mg via ORAL
  Filled 2022-07-15: qty 1

## 2022-07-15 MED ORDER — ATORVASTATIN CALCIUM 40 MG PO TABS: 40.0000 mg | ORAL_TABLET | Freq: Every evening | ORAL | 2 refills | Status: DC

## 2022-07-15 MED ORDER — CEFADROXIL 500 MG PO CAPS: 500.0000 mg | ORAL_CAPSULE | Freq: Two times a day (BID) | ORAL | Status: DC

## 2022-07-15 MED ORDER — METOPROLOL TARTRATE 25 MG PO TABS
25.0000 mg | ORAL_TABLET | Freq: Two times a day (BID) | ORAL | Status: DC
  Administered 2022-07-15 – 2022-07-16 (×3): 25 mg via ORAL
  Filled 2022-07-15 (×3): qty 1

## 2022-07-15 MED ORDER — APIXABAN 2.5 MG PO TABS
2.5000 mg | ORAL_TABLET | Freq: Two times a day (BID) | ORAL | Status: DC
  Administered 2022-07-16: 2.5 mg via ORAL
  Filled 2022-07-15: qty 1

## 2022-07-15 NOTE — Progress Notes (Signed)
Patient is not able to walk the distance required to go the bathroom, or he/she is unable to safely negotiate stairs required to access the bathroom.  A BSC will alleviate this problem.

## 2022-07-15 NOTE — TOC Progression Note (Addendum)
Transition of Care [facility]) - Progression Note    Patient Details  Name: Joshua Rogers MRN: [id number] Date of Birth: Dec 05, 1922  Transition of Care [facility]) CM/SW Contact  Laurena Slimmer, RN Phone Number: 07/15/2022, 3:13 PM  Clinical Narrative:    Case reviewed for DME needs and changes in disposition . Per therapy patient will require HH PT and BSC.         Expected Discharge Plan and Services         Expected Discharge Date: 07/15/22                                     Social Determinants of Health (SDOH) Interventions SDOH Screenings   Food Insecurity: No Food Insecurity (07/13/2022)  Housing: Low Risk  (07/13/2022)  Transportation Needs: No Transportation Needs (07/13/2022)  Utilities: Not At Risk (07/13/2022)  Tobacco Use: Medium Risk (07/13/2022)    Readmission Risk Interventions     No data to display

## 2022-07-15 NOTE — Progress Notes (Signed)
[facility] Cardiology  CARDIOLOGY CONSULT NOTE  Patient ID: AGAMJOT MAZZARA MRN: [id number] DOB/AGE: [birth year] 87 y.o.  Admit date: 07/13/2022 Referring Physician [facility] Primary Physician [place] Primary Cardiologist [facility] Reason for Consultation NSTEMI  HPI: Joshua Rogers. Deblock is a 89 year old male referred for evaluation of NSTEMI.  He has a history of chronic atrial fibrillation, on Eliquis.  He presents to [facility] ED with 2-day history of chest pain, described as "just a hurt", without associated nausea, vomiting or diaphoresis.  She reports that 2 days ago, symptoms started with discomfort in his left arm.  In the emergency room, chest pain appears somewhat improved after sublingual nitroglycerin.  ECG reveals atrial fibrillation at a rate of 90 bpm without acute ischemic ST-T wave changes.  Admission labs were notable for elevated troponin of 1805.  Patient has a history of essential hypertension, blood pressure 155/82 on presentation.  Recent 2D echocardiogram 08/09/2021 revealed LVEF 60-65% with mild to moderate aortic insufficiency, mild AS.  Interval History:  - when ambulatory to the bathroom, had some dyspnea and mild "hurting" in this chest, resolved once back in bed + nitropaste - hemodynamically stable, ate most of his breakfast - echo performed this AM, read pending  Review of systems complete and found to be negative unless listed above     Past Medical History:  Diagnosis Date   CKD (chronic kidney disease)    Diabetes ([place])    HLD (hyperlipidemia)    Hypertension     Past Surgical History:  Procedure Laterality Date   APPENDECTOMY     CATARACT EXTRACTION      Medications Prior to Admission  Medication Sig Dispense Refill Last Dose   apixaban (ELIQUIS) 2.5 MG TABS tablet Take 1 tablet (2.5 mg total) by mouth 2 (two) times daily. 60 tablet 0 07/13/2022 at 0800   dextromethorphan-guaiFENesin (MUCINEX DM) 30-600 MG 12hr tablet Take 1 tablet by mouth 2 (two) times daily as  needed for cough. 10 tablet 0 unk   finasteride (PROSCAR) 5 MG tablet Take 5 mg by mouth daily.   07/12/2022 at 0700   furosemide (LASIX) 20 MG tablet Take 1 tablet (20 mg total) by mouth daily. 30 tablet 1 07/13/2022   levothyroxine (SYNTHROID) 50 MCG tablet Take 50 mcg by mouth daily.   07/13/2022 at 0600   metoprolol tartrate (LOPRESSOR) 25 MG tablet Take 2 tablets (50 mg total) by mouth 2 (two) times daily. 60 tablet 0 07/13/2022 at 0800   zolpidem (AMBIEN) 10 MG tablet Take 10 mg by mouth at bedtime as needed for sleep.   07/12/2022   aspirin 81 MG chewable tablet Chew 1 tablet (81 mg total) by mouth daily. (Patient not taking: Reported on 07/13/2022) 30 tablet 0 Not Taking   atorvastatin (LIPITOR) 40 MG tablet Take 1 tablet (40 mg total) by mouth every evening. (Patient not taking: Reported on 07/13/2022) 30 tablet 0 Not Taking   tamsulosin (FLOMAX) 0.4 MG CAPS capsule Take 0.4 mg by mouth daily. (Patient not taking: Reported on 07/13/2022)   Not Taking    Social History   Socioeconomic History   Marital status: Married    Spouse name: Not on file   Number of children: Not on file   Years of education: Not on file   Highest education level: Not on file  Occupational History   Not on file  Tobacco Use   Smoking status: Former   Smokeless tobacco: Never  Substance and Sexual Activity   Alcohol use: Not Currently  Drug use: Never   Sexual activity: Not Currently  Other Topics Concern   Not on file  Social History Narrative   Not on file   Social Determinants of Health   Financial Resource Strain: Not on file  Food Insecurity: No Food Insecurity (07/13/2022)   Hunger Vital Sign    Worried About Running Out of Food in the Last Year: Never true    Ran Out of Food in the Last Year: Never true  Transportation Needs: No Transportation Needs (07/13/2022)   PRAPARE - Hydrologist (Medical): No    Lack of Transportation (Non-Medical): No  Physical Activity:  Not on file  Stress: Not on file  Social Connections: Not on file  Intimate Partner Violence: Not At Risk (07/13/2022)   Humiliation, Afraid, Rape, and Kick questionnaire    Fear of Current or Ex-Partner: No    Emotionally Abused: No    Physically Abused: No    Sexually Abused: No    Family History  Problem Relation Age of Onset   Stroke Father    Lung cancer Brother    Stroke Brother     PHYSICAL EXAM  General: elderly and thin caucasian male, sitting up in bed with son at bedside HEENT:  Normocephalic and atramatic Neck:  No JVD.  Lungs: Normal respiratory effort on room air. Clear bilaterally to auscultation. Heart: irregularly irregular with controlled rate. S1 present with soft S2, High pitched 3/6 systolic murmur best heard at apex Abdomen: non distended appearing Msk:  Back normal, normal gait. Normal strength and tone for age. Extremities: No clubbing, cyanosis or edema.   Neuro: Alert and oriented X 3. Psych:  Good affect, responds appropriately  Labs:   Lab Results  Component Value Date   WBC 5.6 07/15/2022   HGB 9.0 (L) 07/15/2022   HCT 29.1 (L) 07/15/2022   MCV 80.6 07/15/2022   PLT 197 07/15/2022    Recent Labs  Lab 07/15/22 0427  NA 135  K 3.7  CL 106  CO2 20*  BUN 34*  CREATININE 1.45*  CALCIUM 8.0*  GLUCOSE 110*    Lab Results  Component Value Date   CKTOTAL 44 04/17/2012   CKMB 1.1 04/17/2012   TROPONINI <0.03 10/19/2018    Lab Results  Component Value Date   CHOL 88 07/15/2022   CHOL 121 08/09/2021   Lab Results  Component Value Date   HDL 42 07/15/2022   HDL 47 08/09/2021   Lab Results  Component Value Date   LDLCALC 41 07/15/2022   LDLCALC 60 08/09/2021   Lab Results  Component Value Date   TRIG 25 07/15/2022   TRIG 70 08/09/2021   Lab Results  Component Value Date   CHOLHDL 2.1 07/15/2022   CHOLHDL 2.6 08/09/2021   No results found for: "LDLDIRECT"    Radiology: DG Chest 2 View  Result Date: 07/13/2022 CLINICAL  DATA:  89 year old male with chest pain, midsternal pain radiating to the left arm. EXAM: CHEST - 2 VIEW COMPARISON:  Chest CTA 08/27/2021 and earlier. FINDINGS: AP and lateral views at 1039 hours. Calcified aortic atherosclerosis. Other mediastinal contours are within normal limits. Emphysema demonstrated on prior CTA. No pneumothorax, pulmonary edema, pleural effusion or confluent pulmonary opacity. No acute osseous abnormality identified. Negative visible bowel gas. IMPRESSION: 1. No acute cardiopulmonary abnormality. 2. Aortic Atherosclerosis (ICD10-I70.0) and Emphysema (ICD10-J43.9). Electronically Signed   By: Genevie Ann M.D.   On: 07/13/2022 10:44    EKG: Atrial  fibrillation at 90 bpm with nonspecific ST-T wave abnormalities  ASSESSMENT AND PLAN:   NSTEMI, new onset chest pain x 2 days, with nondiagnostic ECG, peak troponin (3062), improved after sublingual nitroglycerin Mild aortic stenosis w/ mild - mod aortic insufficiency by echo 07/2021 Acute cystitis, on ceftriaxone, culture growing Citrobacter youngae Chronic atrial fibrillation, rate controlled, on Eliquis for stroke prevention Essential hypertension, systolic blood pressure mildly elevated on current BP medications CKD stage IIIb, BUN and creatinine 29 and 1.67, respectively  Recommendations  1.  Agree with current therapy 2.  Will complete 48hrs of heparin infusion 2/27 at 1300 3.  Restart dose reduced eliquis 2.'5mg'$  daily tomorrow AM   - defer antiplatelets + DOAC to minimize bleeding risk  4.  start Imdur '30mg'$  daily 5.  decrease metoprolol tartrate from 50 mg  to '25mg'$  twice daily with relative low BP  6.  Continue atorvastatin 40 mg daily 7.  Review 2D echocardiogram 8.  Continue to defer cardiac catheterization in lieu of patient's advanced age. Patient and his son are in agreement 26.  If ambulatory without chest discomfort this PM after completion of heparin infusion, he is okay for discharge today from a cardiac perspective.   Will arrange for follow-up with Dr. Clayborn Bigness in 1 to 2 weeks following discharge.  This patient's plan of care was discussed and created with Dr. Clayborn Bigness and he is in agreement.    Signed: Alanson Puls Page Pucciarelli PA-C 07/15/2022, 7:53 AM

## 2022-07-15 NOTE — Evaluation (Signed)
Physical Therapy Evaluation Patient Details Name: Joshua Rogers MRN: [id number] DOB: 05-Oct-1922 Today's Date: 07/15/2022  History of Present Illness  Pt is a 89 year old male with history of hypertension, hyperlipidemia, CAD, diet-controlled diabetes mellitus, GERD, CKD stage IIIa, BPH, and atrial fibrillation on Eliquis who presents to the emergency department for chief concerns of chest pain. MD assessment includes: NSTEMI, sepsis secondary to UTI, and chronic atrial fibrillation with RVR.   Clinical Impression  Pt was pleasant and motivated to participate during the session and put forth good effort throughout. Pt required extra time and effort and use of UEs to come to standing from a standard height recliner but needed no physical assist and was steady upon coming to standing.  Pt was able to amb 60 feet with a RW with cues for upright posture and amb closer to the RW but no overt LOB or adverse symptoms reported.  Pt rated the effort of walking as "easy" but did report feeling weaker than at baseline.  Pt will benefit from HHPT upon discharge to safely address deficits listed in patient problem list for decreased caregiver assistance and eventual return to PLOF.         Recommendations for follow up therapy are one component of a multi-disciplinary discharge planning process, led by the attending physician.  Recommendations may be updated based on patient status, additional functional criteria and insurance authorization.  Follow Up Recommendations Home health PT      Assistance Recommended at Discharge Intermittent Supervision/Assistance  Patient can return home with the following  A little help with walking and/or transfers;A little help with bathing/dressing/bathroom;Assist for transportation;Help with stairs or ramp for entrance    Equipment Recommendations BSC/3in1  Recommendations for Other Services       Functional Status Assessment Patient has had a recent decline in  their functional status and demonstrates the ability to make significant improvements in function in a reasonable and predictable amount of time.     Precautions / Restrictions Precautions Precautions: Fall      Mobility  Bed Mobility               General bed mobility comments: NT, pt in recliner    Transfers Overall transfer level: Needs assistance Equipment used: Rolling walker (2 wheels) Transfers: Sit to/from Stand Sit to Stand: Min guard           General transfer comment: Pt required extra time, effort, and use of arm rests to come to standing from a standard height recliner but was steady upon initial stand without LOB    Ambulation/Gait Ambulation/Gait assistance: Min guard Gait Distance (Feet): 60 Feet Assistive device: Rolling walker (2 wheels) Gait Pattern/deviations: Step-through pattern, Decreased step length - right, Decreased step length - left, Trunk flexed Gait velocity: decreased     General Gait Details: Mod verbal cues for amb closer to the RW with upright posture; min reduced cadence but steady throughout without buckling or LOB and with no adverse symptoms  Stairs            Wheelchair Mobility    Modified Rankin (Stroke Patients Only)       Balance Overall balance assessment: Needs assistance   Sitting balance-Leahy Scale: Good     Standing balance support: Bilateral upper extremity supported, During functional activity, Reliant on assistive device for balance Standing balance-Leahy Scale: Fair  Pertinent Vitals/Pain Pain Assessment Pain Assessment: No/denies pain    Home Living Family/patient expects to be discharged to:: Private residence Living Arrangements: Spouse/significant other;Children Available Help at Discharge: Family;Available 24 hours/day Type of Home: House Home Access: Stairs to enter Entrance Stairs-Rails: Right;Left;Can reach both Entrance Stairs-Number of  Steps: 3   Home Layout: One level Home Equipment: Conservation officer, nature (2 wheels);Cane - single point;Shower seat      Prior Function Prior Level of Function : Independent/Modified Independent             Mobility Comments: Mod Ind amb limited community distances with a SPC, mostly a household ambulator but can walk from a parking lot to the MD office, uses a RW PRN, no fall history ADLs Comments: Ind with ADLs     Hand Dominance        Extremity/Trunk Assessment   Upper Extremity Assessment Upper Extremity Assessment: Generalized weakness    Lower Extremity Assessment Lower Extremity Assessment: Generalized weakness       Communication   Communication: No difficulties  Cognition Arousal/Alertness: Awake/alert Behavior During Therapy: WFL for tasks assessed/performed Overall Cognitive Status: Within Functional Limits for tasks assessed                                          General Comments      Exercises Total Joint Exercises Ankle Circles/Pumps: AROM, Strengthening, Both, 10 reps, 5 reps Quad Sets: Strengthening, Both, 5 reps, 10 reps Gluteal Sets: Strengthening, Both, 5 reps, 10 reps Long Arc Quad: Strengthening, Both, 10 reps Knee Flexion: Strengthening, Both, 10 reps Marching in Standing: Strengthening, Both, 10 reps, Seated Other Exercises Other Exercises: HEP education for BLE APs, QS, GS, and LAQs x 10 each 5-6x/day   Assessment/Plan    PT Assessment Patient needs continued PT services  PT Problem List Decreased strength;Decreased activity tolerance;Decreased balance;Decreased mobility;Decreased knowledge of use of DME       PT Treatment Interventions DME instruction;Gait training;Stair training;Functional mobility training;Therapeutic activities;Therapeutic exercise;Balance training;Patient/family education    PT Goals (Current goals can be found in the Care Plan section)  Acute Rehab PT Goals Patient Stated Goal: To get  stronger PT Goal Formulation: With patient Time For Goal Achievement: 07/28/22 Potential to Achieve Goals: Good    Frequency Min 2X/week     Co-evaluation               AM-PAC PT "6 Clicks" Mobility  Outcome Measure Help needed turning from your back to your side while in a flat bed without using bedrails?: A Little Help needed moving from lying on your back to sitting on the side of a flat bed without using bedrails?: A Little Help needed moving to and from a bed to a chair (including a wheelchair)?: A Little Help needed standing up from a chair using your arms (e.g., wheelchair or bedside chair)?: A Little Help needed to walk in hospital room?: A Little Help needed climbing 3-5 steps with a railing? : A Little 6 Click Score: 18    End of Session Equipment Utilized During Treatment: Gait belt Activity Tolerance: Patient tolerated treatment well Patient left: in chair;with call bell/phone within reach;with chair alarm set Nurse Communication: Mobility status PT Visit Diagnosis: Difficulty in walking, not elsewhere classified (R26.2);Muscle weakness (generalized) (M62.81)    Time: [id number] PT Time Calculation (min) (ACUTE ONLY): 33 min   Charges:   PT  Evaluation $PT Eval Moderate Complexity: 1 Mod PT Treatments $Therapeutic Exercise: 8-22 mins       D. Royetta Asal PT, DPT 07/15/22, 3:29 PM

## 2022-07-15 NOTE — Progress Notes (Addendum)
Progress Note   Patient: Joshua Rogers [id number] DOB: 02/01/1923 DOA: 07/13/2022     2 DOS: the patient was seen and examined on 07/15/2022   Brief hospital course: Mr. Joshua Rogers is a 89 year old male with history of hypertension, hyperlipidemia, diet-controlled diabetes mellitus, GERD, CKD stage IIIa, BPH, atrial fibrillation on Eliquis, who presented to the ED on 07/13/2022 for evaluation of chest pain.  Initial vitals in the ED were stable.   Labs were notable for creatinine 1.67, glucose 361, and mild anemia Hbg 10.2.  Initial hs-troponin was elevated at 1805, trended up to 3062. Urinalysis with positive nitrite, large leukocytes, rare bacteria > 50 wbc's.  Patient was admitted to medicine service with cardiology consulted. Started on heparin drip for NSTEMI, and IV antibiotics for UTI pending culture results.   Cardiology recommended conservative, medical management. No plan for cardiac cath at this time.   Assessment and Plan: * NSTEMI (non-ST elevated myocardial infarction) (Joshua Rogers) Presented with chest pain, hs-troponin 1805 >> 3062. --Mgmt per Cardiology --Treated with 48 hrs IV heparin  --No plan for invasive procedures, conservative medical mgmt recommended --Nitropaste started --PRN SL nitro, IV morphine --Continue metoprolol, Lipitor --Follow pending Echo report   Sepsis secondary to UTI (Joshua Rogers) Sepsis POA with tachypnea, hypothermia and  UA was consistent with infection. --Continue empiric IV Rocephin --Follow blood and urine cultures --Trend lactic acid (2.0 earlier today, given 250 cc) --Monitor fever curve, CBC, hemodynamics closely Sepsis physiology improved.  ADDENDUM AFTERNOON -- lactic acid trended up 2.0 >> 2.7.  Resumed on some maintenance fluids, NS @ 75 cc/hr.  Suspect dehydration more than sepsis at this point. Continue holding Lasix.  Discharge cancelled.  UTI (urinary tract infection) As above  BPH (benign prostatic hyperplasia) Chronic  urinary retention - Patient does self cath at home  - Foley placed on admission at family's request for convenience during admission --D/C Foley this afternoon, resume in/out cath -- Admitted with UTI on empiric IV antibiotic -- UTI likely due to retention -- Follow up with Urology   CAD (coronary artery disease) See NSTEMI  Chronic atrial fibrillation with RVR (Joshua Rogers) Hold Eliquis while on heparin gtt for NSTEMI. Continue metoprolol tartrate 50 mg PO BID. Telemetry monitoring.  Diabetes mellitus type 2, noninsulin dependent (Joshua Rogers) With hyperglycemia, glucose 361 on admission --Sliding scale Novolog --Adjust for goal 140-180  DNR (do not resuscitate) Noted  Hypothyroidism Continue levothyroxine   HLD (hyperlipidemia) Continue Lipitor  Pyuria See UTI        Subjective: Pt awake sitting up in bed when seen this AM. Nurse at bedside. Pt reports not having chest pain since about 2 days ago.  Ambulating to bathroom well with his walker, but he reports feeling a bit weak compared to baseline.  No other acute complaints.     Physical Exam: Vitals:   07/14/22 2319 07/15/22 0407 07/15/22 0817 07/15/22 1157  BP: (!) 92/56 103/65 122/70 122/66  Pulse: 70 84 90 87  Resp: '16 14 15 20  '$ Temp: 98 F (36.7 C) 98.1 F (36.7 C) 97.8 F (36.6 C) (!) 97.4 F (36.3 C)  TempSrc: Oral   Oral  SpO2: 100% 98% 99% 98%  Weight:      Height:       General exam: awake, alert, no acute distress HEENT: atraumatic, clear conjunctiva, anicteric sclera, moist mucus membranes, hearing grossly normal  Respiratory system: CTAB, no wheezes, rales or rhonchi, normal respiratory effort. Cardiovascular system: normal S1/S2, RRR, no pedal edema.   Gastrointestinal  system: soft, NT, ND, no HSM felt, +bowel sounds. Central nervous system: A&O x4. no gross focal neurologic deficits, normal speech Extremities: moves all , no edema, normal tone Skin: dry, intact, normal temperature, normal color, No  rashes, lesions or ulcers Psychiatry: normal mood, congruent affect, judgement and insight appear normal  Data Reviewed:  Notable labs --- lactic acid 2.0 >> 2.7, bicarb 20, glucose 110, BUN 34, Cr 1.45, Ca 8.0, GFR 44.  Normal lipid profile.  CBC with Hbg 9.0  Family Communication: Attempted to call daughter this afternoon, left voicemail  Disposition: Status is: Inpatient Remains inpatient appropriate because: Remains on IV fluids for elevated lactic acid. Cardiology cleared for discharge. Anticipate d/c home with HHPT tomorrow.    Planned Discharge Destination: Home    Time spent: 38 minutes  Author: Ezekiel Slocumb, DO 07/15/2022 3:48 PM  For on call review [email or web address].

## 2022-07-15 NOTE — Consult Note (Signed)
ANTICOAGULATION CONSULT NOTE   Pharmacy Consult for Heparin Infusion Indication: chest pain/ACS  No Known Allergies  Patient Measurements: Height: 5' 9.5" (176.5 cm) Weight: 72.3 kg (159 lb 4.8 oz) IBW/kg (Calculated) : 71.85 Heparin Dosing Weight: 70.3 kg  Vital Signs: Temp: 98.1 F (36.7 C) (02/27 0407) Temp Source: Oral (02/26 2319) BP: 103/65 (02/27 0407) Pulse Rate: 84 (02/27 0407)  Labs: Recent Labs    07/13/22 1028 07/13/22 1228 07/13/22 1532 07/13/22 1532 07/14/22 0004 07/14/22 0626 07/14/22 0826 07/15/22 0427  HGB 10.2*  --   --   --   --  9.6*  --  9.0*  HCT 33.0*  --   --   --   --  30.3*  --  29.1*  PLT 212  --   --   --   --  183  --  197  APTT  --   --  33   < > 76*  --  77* 82*  LABPROT  --   --  14.9  --   --   --   --   --   INR  --   --  1.2  --   --   --   --   --   HEPARINUNFRC  --   --  >1.10*  --   --   --  >1.10* 0.74*  CREATININE 1.67*  --   --   --   --  1.58*  --   --   TROPONINIHS 1,805* 3,062*  --   --   --   --   --   --    < > = values in this interval not displayed.     Estimated Creatinine Clearance: 26.5 mL/min (A) (by C-G formula based on SCr of 1.58 mg/dL (H)).   Medical History: Past Medical History:  Diagnosis Date   CKD (chronic kidney disease)    Diabetes ([place])    HLD (hyperlipidemia)    Hypertension     Medications:  Scheduled:   aspirin  81 mg Oral Daily   atorvastatin  40 mg Oral QPM   Chlorhexidine Gluconate Cloth  6 each Topical Q0600   finasteride  5 mg Oral Daily   insulin aspart  0-5 Units Subcutaneous QHS   insulin aspart  0-9 Units Subcutaneous TID WC   levothyroxine  50 mcg Oral QAC breakfast   metoprolol tartrate  50 mg Oral BID   nitroGLYCERIN  0.5 inch Topical Q6H   Infusions:   cefTRIAXone (ROCEPHIN)  IV 2 g (07/14/22 2053)   heparin 850 Units/hr (07/14/22 2056)   PRN: acetaminophen **OR** acetaminophen, benzonatate, guaiFENesin, morphine injection, ondansetron **OR** ondansetron (ZOFRAN) IV,  senna-docusate, zolpidem  Assessment: Joshua Rogers is a 89 y.o. male presenting with chest pain. PMH significant for AF on Eliquis, HLD, T2DM, BPH, hypothyroidism, CAD. Patient was on [facility] PTA per chart review. Last dose of Eliquis 2.5 mg reported to be 2/25 AM. Pharmacy has been consulted to initiate and manage heparin infusion.   Baseline Labs: Hgb 10.2, Hct 33.0, Plt 212; aPTT, HL, PT, INR ordered  Goal of Therapy:  Heparin level 0.3-0.7 units/ml aPTT 60-102 seconds Monitor platelets by anticoagulation protocol: Yes   2/26 0004 aPTT 76, therapeutic x 1 2/26 0826 aPTT 77, HL 1.10 2/27 0427 aPTT 82, HL 0.74, therapeutic X 3  Plan:  2/27 @ 0427: aPTT = 82, HL = 0.74 aPTT therapeutic X 3 , HL still slightly elevated from Eliquis Continue to  monitor aPTT until HL and aPTT correlate.  Check HL daily for correlation until HL and aPTT correlate. Switch to HL monitoring once HL and aPTT correlate.   Riyanshi Wahab D 07/15/2022 5:09 AM

## 2022-07-16 ENCOUNTER — Other Ambulatory Visit: Payer: Self-pay

## 2022-07-16 DIAGNOSIS — I214 Non-ST elevation (NSTEMI) myocardial infarction: Secondary | ICD-10-CM | POA: Diagnosis not present

## 2022-07-16 DIAGNOSIS — A419 Sepsis, unspecified organism: Secondary | ICD-10-CM

## 2022-07-16 DIAGNOSIS — I482 Chronic atrial fibrillation, unspecified: Secondary | ICD-10-CM | POA: Diagnosis not present

## 2022-07-16 DIAGNOSIS — N390 Urinary tract infection, site not specified: Secondary | ICD-10-CM

## 2022-07-16 LAB — CBC
HCT: 29.7 % — ABNORMAL LOW (ref 39.0–52.0)
Hemoglobin: 9.4 g/dL — ABNORMAL LOW (ref 13.0–17.0)
MCH: 25.9 pg — ABNORMAL LOW (ref 26.0–34.0)
MCHC: 31.6 g/dL (ref 30.0–36.0)
MCV: 81.8 fL (ref 80.0–100.0)
Platelets: 187 10*3/uL (ref 150–400)
RBC: 3.63 MIL/uL — ABNORMAL LOW (ref 4.22–5.81)
RDW: 18.4 % — ABNORMAL HIGH (ref 11.5–15.5)
WBC: 5.2 10*3/uL (ref 4.0–10.5)
nRBC: 0 % (ref 0.0–0.2)

## 2022-07-16 LAB — BASIC METABOLIC PANEL
Anion gap: 7 (ref 5–15)
BUN: 34 mg/dL — ABNORMAL HIGH (ref 8–23)
CO2: 20 mmol/L — ABNORMAL LOW (ref 22–32)
Calcium: 7.8 mg/dL — ABNORMAL LOW (ref 8.9–10.3)
Chloride: 110 mmol/L (ref 98–111)
Creatinine, Ser: 1.42 mg/dL — ABNORMAL HIGH (ref 0.61–1.24)
GFR, Estimated: 45 mL/min — ABNORMAL LOW (ref >60)
Glucose, Bld: 109 mg/dL — ABNORMAL HIGH (ref 70–99)
Potassium: 3.8 mmol/L (ref 3.5–5.1)
Sodium: 137 mmol/L (ref 135–145)

## 2022-07-16 LAB — GLUCOSE, CAPILLARY
Glucose-Capillary: 129 mg/dL — ABNORMAL HIGH (ref 70–99)
Glucose-Capillary: 245 mg/dL — ABNORMAL HIGH (ref 70–99)

## 2022-07-16 LAB — LACTIC ACID, PLASMA: Lactic Acid, Venous: 1.1 mmol/L (ref 0.5–1.9)

## 2022-07-16 MED ORDER — CIPROFLOXACIN HCL 250 MG PO TABS: 250.0000 mg | ORAL_TABLET | Freq: Two times a day (BID) | ORAL | 0 refills | Status: AC

## 2022-07-16 MED ORDER — METOPROLOL TARTRATE 50 MG PO TABS: 50.0000 mg | ORAL_TABLET | Freq: Two times a day (BID) | ORAL | Status: DC

## 2022-07-16 NOTE — Plan of Care (Signed)

## 2022-07-16 NOTE — Progress Notes (Signed)
Discharge order in place, instructions given to the patient.  Patient verbalized understanding.  No complaints offered, denies pain at this time.  Needs addressed.  Daughter informed of discharge.  Discharged home.

## 2022-07-16 NOTE — TOC Transition Note (Signed)
Transition of Care [facility]) - CM/SW Discharge Note   Patient Details  Name: Joshua Rogers MRN: [id number] Date of Birth: 1922/06/02  [facility] CM/SW Contact:  Laurena Slimmer, RN Phone Number: 07/16/2022, 1:12 PM   Clinical Narrative:    Spoke with Freddrick March, patient's daughter regarding HH. She and family is agreeable to [facility]. They would prefer [facility]. Patrici Ranks would prefr to be the point of contact. Patient's daughter stated he has a BSC at home already.   Referral sent and accepted by Floydene Flock from [facility].           Patient Goals and CMS Choice      Discharge Placement                         Discharge Plan and Services Additional resources added to the After Visit Summary for                                       Social Determinants of Health (SDOH) Interventions SDOH Screenings   Food Insecurity: No Food Insecurity (07/13/2022)  Housing: Low Risk  (07/13/2022)  Transportation Needs: No Transportation Needs (07/13/2022)  Utilities: Not At Risk (07/13/2022)  Tobacco Use: Medium Risk (07/13/2022)     Readmission Risk Interventions     No data to display

## 2022-07-16 NOTE — Care Management Important Message (Signed)
Important Message  Patient Details  Name: Joshua Rogers MRN: [id number] Date of Birth: 28-Sep-1922   Medicare Important Message Given:  Yes     Dannette Barbara 07/16/2022, 10:55 AM

## 2022-07-16 NOTE — Progress Notes (Signed)
Given beta blocker and the rest of pt's heart meds.  Pt states pressure pain in chest is not worse, "feeling better, just this coughing that hurts my throat".  No further complaints, no distress at this time.

## 2022-07-16 NOTE — Progress Notes (Signed)
Pt c/o pressure pain in his chest, 2/10.  Dr. Mal Misty made aware.  VSS, EKG obtained. Pt needs addressed.

## 2022-07-16 NOTE — Discharge Summary (Signed)
Physician Discharge Summary   Patient: Joshua Rogers MRN: [id number] DOB: [birth year]  Admit date:     07/13/2022  Discharge date: 07/16/22  Discharge Physician: Jennye Boroughs   PCP: Idelle Crouch, MD   Recommendations at discharge:   Follow-up with PCP in 1 week Follow-up with Dr. Clayborn Bigness, cardiologist, in 1 week  Discharge Diagnoses: Principal Problem:   NSTEMI (non-ST elevated myocardial infarction) ([place]) Active Problems:   BPH (benign prostatic hyperplasia)   UTI (urinary tract infection)   Sepsis secondary to UTI ([place])   Diabetes mellitus type 2, noninsulin dependent ([place])   Chronic atrial fibrillation with RVR ([facility])   CAD (coronary artery disease)   HLD (hyperlipidemia)   Hypothyroidism   DNR (do not resuscitate)   Pyuria  Resolved Problems:   * No resolved hospital problems. [facility] Course:  Joshua Rogers is a 89 year old male with history of hypertension, hyperlipidemia, diet-controlled diabetes mellitus, GERD, CKD stage IIIa, BPH, atrial fibrillation on Eliquis, who presented to the ED on 07/13/2022 for evaluation of chest pain.  His troponins were elevated (went from [phone]).  He was admitted to the hospital for acute NSTEMI.  He was treated with IV heparin drip.  Cardiologist was consulted and conservative management was recommended.  He was also found to have sepsis secondary to acute UTI.  He was treated with empiric IV ceftriaxone and switched to ciprofloxacin at discharge.  Urine culture showed Citrobacter youngae.  His condition has improved and he is deemed stable for discharge to home today.  Discharge plan was discussed with his daughter, Patrici Ranks, over the phone.          Consultants: Cardiologist Procedures performed: None Disposition: Home health Diet recommendation:  Discharge Diet Orders (From admission, onward)     Start     Ordered   07/15/22 0000  Diet - low sodium heart healthy        07/15/22 1513            Cardiac and Carb modified diet DISCHARGE MEDICATION: Allergies as of 07/16/2022   No Known Allergies      Medication List     STOP taking these medications    aspirin 81 MG chewable tablet   furosemide 20 MG tablet Commonly known as: LASIX   tamsulosin 0.4 MG Caps capsule Commonly known as: FLOMAX       TAKE these medications    apixaban 2.5 MG Tabs tablet Commonly known as: ELIQUIS Take 1 tablet (2.5 mg total) by mouth 2 (two) times daily.   atorvastatin 40 MG tablet Commonly known as: LIPITOR Take 1 tablet (40 mg total) by mouth every evening.   ciprofloxacin 250 MG tablet Commonly known as: CIPRO Take 1 tablet (250 mg total) by mouth 2 (two) times daily for 2 days.   dextromethorphan-guaiFENesin 30-600 MG 12hr tablet Commonly known as: MUCINEX DM Take 1 tablet by mouth 2 (two) times daily as needed for cough.   finasteride 5 MG tablet Commonly known as: PROSCAR Take 5 mg by mouth daily.   isosorbide mononitrate 30 MG 24 hr tablet Commonly known as: IMDUR Take 1 tablet (30 mg total) by mouth daily.   levothyroxine 50 MCG tablet Commonly known as: SYNTHROID Take 50 mcg by mouth daily.   metoprolol tartrate 25 MG tablet Commonly known as: LOPRESSOR Take 2 tablets (50 mg total) by mouth 2 (two) times daily.   zolpidem 10 MG tablet Commonly known as: AMBIEN Take 10 mg by mouth at bedtime  as needed for sleep.               Durable Medical Equipment  (From admission, onward)           Start     Ordered   07/15/22 1508  For home use only DME 3 n 1  Once        07/15/22 1507            Follow-up Information     Callwood, Dwayne D, MD. Go in 1 week(s).   Specialties: Cardiology, Internal Medicine Contact information: [place] [address] [phone]                Discharge Exam: Danley Danker Weights   07/13/22 1025 07/13/22 1517  Weight: 70.3 kg 72.3 kg   GEN: NAD SKIN: Warm and dry EYES: No  pallor or icterus ENT: MMM CV: RRR PULM: CTA B ABD: soft, ND, NT, +BS CNS: AAO x 3, non focal EXT: No edema or tenderness   Condition at discharge: good  The results of significant diagnostics from this hospitalization (including imaging, microbiology, ancillary and laboratory) are listed below for reference.   Imaging Studies: ECHOCARDIOGRAM COMPLETE  Result Date: 07/15/2022    ECHOCARDIOGRAM REPORT   Patient Name:   Joshua Rogers Date of Exam: 07/14/2022 Medical Rec #:  [id number]         Height:       69.5 in Accession #:    [id number]        Weight:       159.3 lb Date of Birth:  1922/07/18          BSA:          1.885 m Patient Age:    80 years          BP:           130/78 mmHg Patient Gender: M                 HR:           90 bpm. Exam Location:  [facility] Procedure: 2D Echo, Cardiac Doppler and Color Doppler Indications:     Chest pain R07.9                  Elevated troponin  History:         Patient has prior history of Echocardiogram examinations, most                  recent 08/09/2021. Risk Factors:Hypertension, Diabetes and                  Dyslipidemia.  Sonographer:     Sherrie Sport Referring Phys:  [id number] AMY N COX Diagnosing Phys: Yolonda Kida MD IMPRESSIONS  1. Anterior apical hypo.  2. Left ventricular ejection fraction, by estimation, is 35 to 40%. The left ventricle has moderately decreased function. The left ventricle demonstrates regional wall motion abnormalities (see scoring diagram/findings for description). There is moderate concentric left ventricular hypertrophy. Left ventricular diastolic parameters are consistent with Grade I diastolic dysfunction (impaired relaxation).  3. Right ventricular systolic function is normal. The right ventricular size is normal.  4. Left atrial size was mildly dilated.  5. The mitral valve is grossly normal. Mild mitral valve regurgitation.  6. The aortic valve is calcified. Aortic valve regurgitation is mild. Mild to moderate aortic valve  stenosis. FINDINGS  Left Ventricle: Left ventricular ejection fraction, by estimation, is  35 to 40%. The left ventricle has moderately decreased function. The left ventricle demonstrates regional wall motion abnormalities. The left ventricular internal cavity size was normal in size. There is moderate concentric left ventricular hypertrophy. Left ventricular diastolic parameters are consistent with Grade I diastolic dysfunction (impaired relaxation). Right Ventricle: The right ventricular size is normal. No increase in right ventricular wall thickness. Right ventricular systolic function is normal. Left Atrium: Left atrial size was mildly dilated. Right Atrium: Right atrial size was normal in size. Pericardium: There is no evidence of pericardial effusion. Mitral Valve: The mitral valve is grossly normal. Mild mitral valve regurgitation. Tricuspid Valve: The tricuspid valve is normal in structure. Tricuspid valve regurgitation is mild. Aortic Valve: The aortic valve is calcified. Aortic valve regurgitation is mild. Aortic regurgitation PHT measures 407 msec. Mild to moderate aortic stenosis is present. Aortic valve mean gradient measures 12.3 mmHg. Aortic valve peak gradient measures 22.0 mmHg. Aortic valve area, by VTI measures 1.10 cm. Pulmonic Valve: The pulmonic valve was normal in structure. Pulmonic valve regurgitation is not visualized. Aorta: The ascending aorta was not well visualized. IAS/Shunts: No atrial level shunt detected by color flow Doppler. Additional Comments: Anterior apical hypo.  LEFT VENTRICLE PLAX 2D LVIDd:         4.00 cm LVIDs:         2.80 cm LV PW:         1.50 cm LV IVS:        1.50 cm LVOT diam:     2.00 cm LV SV:         44 LV SV Index:   23 LVOT Area:     3.14 cm  LV Volumes (MOD) LV vol d, MOD A2C: 79.0 ml LV vol d, MOD A4C: 66.4 ml LV vol s, MOD A2C: 46.1 ml LV vol s, MOD A4C: 49.3 ml LV SV MOD A2C:     32.9 ml LV SV MOD A4C:     66.4 ml LV SV MOD BP:      25.7 ml RIGHT VENTRICLE  RV Basal diam:  3.30 cm RV Mid diam:    2.40 cm RV S prime:     16.20 cm/s TAPSE (M-mode): 1.9 cm LEFT ATRIUM             Index        RIGHT ATRIUM           Index LA diam:        4.10 cm 2.18 cm/m   RA Area:     13.60 cm LA Vol (A2C):   30.1 ml 15.97 ml/m  RA Volume:   30.90 ml  16.39 ml/m LA Vol (A4C):   51.0 ml 27.06 ml/m LA Biplane Vol: 42.7 ml 22.65 ml/m  AORTIC VALVE AV Area (Vmax):    0.98 cm AV Area (Vmean):   0.93 cm AV Area (VTI):     1.10 cm AV Vmax:           234.67 cm/s AV Vmean:          162.667 cm/s AV VTI:            0.397 m AV Peak Grad:      22.0 mmHg AV Mean Grad:      12.3 mmHg LVOT Vmax:         73.40 cm/s LVOT Vmean:        48.100 cm/s LVOT VTI:          0.139 m LVOT/AV  VTI ratio: 0.35 AI PHT:            407 msec  AORTA Ao Root diam: 3.60 cm MITRAL VALVE               TRICUSPID VALVE MV Area (PHT): 5.02 cm    TR Peak grad:   26.4 mmHg MV Decel Time: 151 msec    TR Vmax:        257.00 cm/s MV E velocity: 98.50 cm/s                            SHUNTS                            Systemic VTI:  0.14 m                            Systemic Diam: 2.00 cm Yolonda Kida MD Electronically signed by Yolonda Kida MD Signature Date/Time: 07/15/2022/3:32:04 PM    Final    DG Chest 2 View  Result Date: 07/13/2022 CLINICAL DATA:  89 year old male with chest pain, midsternal pain radiating to the left arm. EXAM: CHEST - 2 VIEW COMPARISON:  Chest CTA 08/27/2021 and earlier. FINDINGS: AP and lateral views at 1039 hours. Calcified aortic atherosclerosis. Other mediastinal contours are within normal limits. Emphysema demonstrated on prior CTA. No pneumothorax, pulmonary edema, pleural effusion or confluent pulmonary opacity. No acute osseous abnormality identified. Negative visible bowel gas. IMPRESSION: 1. No acute cardiopulmonary abnormality. 2. Aortic Atherosclerosis (ICD10-I70.0) and Emphysema (ICD10-J43.9). Electronically Signed   By: Genevie Ann M.D.   On: 07/13/2022 10:44     Microbiology: Results for orders placed or performed during the hospital encounter of 07/13/22  Urine Culture     Status: Abnormal   Collection Time: 07/13/22 12:49 PM   Specimen: Urine, Clean Catch  Result Value Ref Range Status   Specimen Description   Final    URINE, CLEAN CATCH Performed at [facility], [address], [place], [place] [address]    Special Requests   Final    NONE Performed at [facility], [address] [place], [place] [address]    Culture >=100,000 COLONIES/mL Su Hoff (A)  Final   Report Status 07/15/2022 FINAL  Final   Organism ID, Bacteria CITROBACTER YOUNGAE (A)  Final      Susceptibility   Citrobacter youngae - MIC*    CEFEPIME <=0.12 SENSITIVE Sensitive     CEFTRIAXONE <=0.25 SENSITIVE Sensitive     CIPROFLOXACIN <=0.25 SENSITIVE Sensitive     GENTAMICIN <=1 SENSITIVE Sensitive     IMIPENEM <=0.25 SENSITIVE Sensitive     NITROFURANTOIN <=16 SENSITIVE Sensitive     TRIMETH/SULFA <=20 SENSITIVE Sensitive     PIP/TAZO <=4 SENSITIVE Sensitive     * >=100,000 COLONIES/mL CITROBACTER YOUNGAE  Culture, blood (Routine X 2) w Reflex to ID Panel     Status: None (Preliminary result)   Collection Time: 07/13/22  3:32 PM   Specimen: BLOOD  Result Value Ref Range Status   Specimen Description BLOOD RIGHT [facility]  Final   Special Requests   Final    BOTTLES DRAWN AEROBIC AND ANAEROBIC Blood Culture adequate volume   Culture   Final    NO GROWTH 3 DAYS Performed at [facility], [address] [place], [place] [address]    Report  Status PENDING  Incomplete  Culture, blood (Routine X 2) w Reflex to ID Panel     Status: None (Preliminary result)   Collection Time: 07/13/22  3:40 PM   Specimen: BLOOD  Result Value Ref Range Status   Specimen Description BLOOD brh  Final   Special Requests   Final    BOTTLES DRAWN AEROBIC AND ANAEROBIC Blood Culture adequate volume   Culture   Final    NO GROWTH 3 DAYS Performed  at [facility], [address] [place], [place] [address]    Report Status PENDING  Incomplete    Labs: CBC: Recent Labs  Lab 07/13/22 1028 07/14/22 0626 07/15/22 0427 07/16/22 0332  WBC 5.5 6.8 5.6 5.2  HGB 10.2* 9.6* 9.0* 9.4*  HCT 33.0* 30.3* 29.1* 29.7*  MCV 82.9 81.5 80.6 81.8  PLT 212 183 197 [id number]   Basic Metabolic Panel: Recent Labs  Lab 07/13/22 1028 07/13/22 1238 07/14/22 0626 07/15/22 0427 07/16/22 0332  NA 134*  --  136 135 137  K 3.8  --  4.0 3.7 3.8  CL 103  --  106 106 110  CO2 23  --  23 20* 20*  GLUCOSE 361*  --  164* 110* 109*  BUN 29*  --  30* 34* 34*  CREATININE 1.67*  --  1.58* 1.45* 1.42*  CALCIUM 8.6*  --  8.3* 8.0* 7.8*  MG  --  2.1  --   --   --   PHOS  --  3.2  --   --   --    Liver Function Tests: No results for input(s): "AST", "ALT", "ALKPHOS", "BILITOT", "PROT", "ALBUMIN" in the last 168 hours. CBG: Recent Labs  Lab 07/15/22 1158 07/15/22 1553 07/15/22 2004 07/16/22 0736 07/16/22 1218  GLUCAP 283* 131* 127* 129* 245*    Discharge time spent: greater than 30 minutes.  Signed: Jennye Boroughs, MD [facility] 07/16/2022

## 2022-07-18 LAB — CULTURE, BLOOD (ROUTINE X 2)
Culture: NO GROWTH
Culture: NO GROWTH
Special Requests: ADEQUATE
Special Requests: ADEQUATE

## 2022-08-18 DEATH — deceased
